# Patient Record
Sex: Female | Born: 1960 | Race: White | Hispanic: No | Marital: Married | Smoking: Current every day smoker
Health system: Southern US, Community
[De-identification: ages and names within clinical notes are randomized; demographics above are authoritative.]

## PROBLEM LIST (undated history)

## (undated) DIAGNOSIS — F329 Major depressive disorder, single episode, unspecified: Secondary | ICD-10-CM

## (undated) DIAGNOSIS — F17200 Nicotine dependence, unspecified, uncomplicated: Secondary | ICD-10-CM

## (undated) DIAGNOSIS — I10 Essential (primary) hypertension: Secondary | ICD-10-CM

## (undated) DIAGNOSIS — G43909 Migraine, unspecified, not intractable, without status migrainosus: Secondary | ICD-10-CM

## (undated) DIAGNOSIS — G5600 Carpal tunnel syndrome, unspecified upper limb: Secondary | ICD-10-CM

## (undated) DIAGNOSIS — F32A Depression, unspecified: Secondary | ICD-10-CM

## (undated) DIAGNOSIS — F419 Anxiety disorder, unspecified: Secondary | ICD-10-CM

## (undated) DIAGNOSIS — F99 Mental disorder, not otherwise specified: Secondary | ICD-10-CM

## (undated) DIAGNOSIS — R002 Palpitations: Secondary | ICD-10-CM

## (undated) DIAGNOSIS — F319 Bipolar disorder, unspecified: Secondary | ICD-10-CM

## (undated) HISTORY — DX: Mental disorder, not otherwise specified: F99

## (undated) HISTORY — PX: LAPAROSCOPIC ENDOMETRIOSIS FULGURATION: SUR769

## (undated) HISTORY — DX: Major depressive disorder, single episode, unspecified: F32.9

## (undated) HISTORY — DX: Bipolar disorder, unspecified: F31.9

## (undated) HISTORY — DX: Essential (primary) hypertension: I10

## (undated) HISTORY — DX: Depression, unspecified: F32.A

## (undated) HISTORY — PX: TONSILLECTOMY AND ADENOIDECTOMY: SUR1326

## (undated) HISTORY — DX: Palpitations: R00.2

## (undated) HISTORY — DX: Carpal tunnel syndrome, unspecified upper limb: G56.00

## (undated) HISTORY — PX: TOTAL ABDOMINAL HYSTERECTOMY: SHX209

## (undated) HISTORY — DX: Anxiety disorder, unspecified: F41.9

## (undated) HISTORY — DX: Migraine, unspecified, not intractable, without status migrainosus: G43.909

## (undated) HISTORY — DX: Nicotine dependence, unspecified, uncomplicated: F17.200

---

## 2001-06-23 ENCOUNTER — Ambulatory Visit (HOSPITAL_COMMUNITY): Admission: RE | Admit: 2001-06-23 | Discharge: 2001-06-23 | Payer: Self-pay | Admitting: Family Medicine

## 2003-02-09 ENCOUNTER — Other Ambulatory Visit: Admission: RE | Admit: 2003-02-09 | Discharge: 2003-02-09 | Payer: Self-pay | Admitting: Gynecology

## 2003-08-28 DIAGNOSIS — I10 Essential (primary) hypertension: Secondary | ICD-10-CM

## 2003-08-28 DIAGNOSIS — F99 Mental disorder, not otherwise specified: Secondary | ICD-10-CM

## 2003-08-28 HISTORY — DX: Mental disorder, not otherwise specified: F99

## 2003-08-28 HISTORY — DX: Essential (primary) hypertension: I10

## 2004-05-09 ENCOUNTER — Other Ambulatory Visit: Admission: RE | Admit: 2004-05-09 | Discharge: 2004-05-09 | Payer: Self-pay | Admitting: Gynecology

## 2004-07-21 ENCOUNTER — Emergency Department (HOSPITAL_COMMUNITY): Admission: EM | Admit: 2004-07-21 | Discharge: 2004-07-22 | Payer: Self-pay | Admitting: Emergency Medicine

## 2005-12-02 ENCOUNTER — Emergency Department (HOSPITAL_COMMUNITY): Admission: EM | Admit: 2005-12-02 | Discharge: 2005-12-03 | Payer: Self-pay | Admitting: Emergency Medicine

## 2006-01-12 ENCOUNTER — Other Ambulatory Visit: Admission: RE | Admit: 2006-01-12 | Discharge: 2006-01-12 | Payer: Self-pay | Admitting: Gynecology

## 2006-07-07 ENCOUNTER — Inpatient Hospital Stay (HOSPITAL_COMMUNITY): Admission: RE | Admit: 2006-07-07 | Discharge: 2006-07-09 | Payer: Self-pay | Admitting: Obstetrics and Gynecology

## 2006-07-07 ENCOUNTER — Encounter (INDEPENDENT_AMBULATORY_CARE_PROVIDER_SITE_OTHER): Payer: Self-pay | Admitting: Specialist

## 2007-01-22 ENCOUNTER — Emergency Department (HOSPITAL_COMMUNITY): Admission: EM | Admit: 2007-01-22 | Discharge: 2007-01-22 | Payer: Self-pay | Admitting: Emergency Medicine

## 2007-09-07 IMAGING — CR DG ABDOMEN ACUTE W/ 1V CHEST
3 series · 3 of 3 positions shown · non-contrast
Comparison: none

CLINICAL DATA: Chest pain, nausea and vomiting. 
 ACUTE ABDOMINAL SERIES:

[view not recorded (1 of 3)]
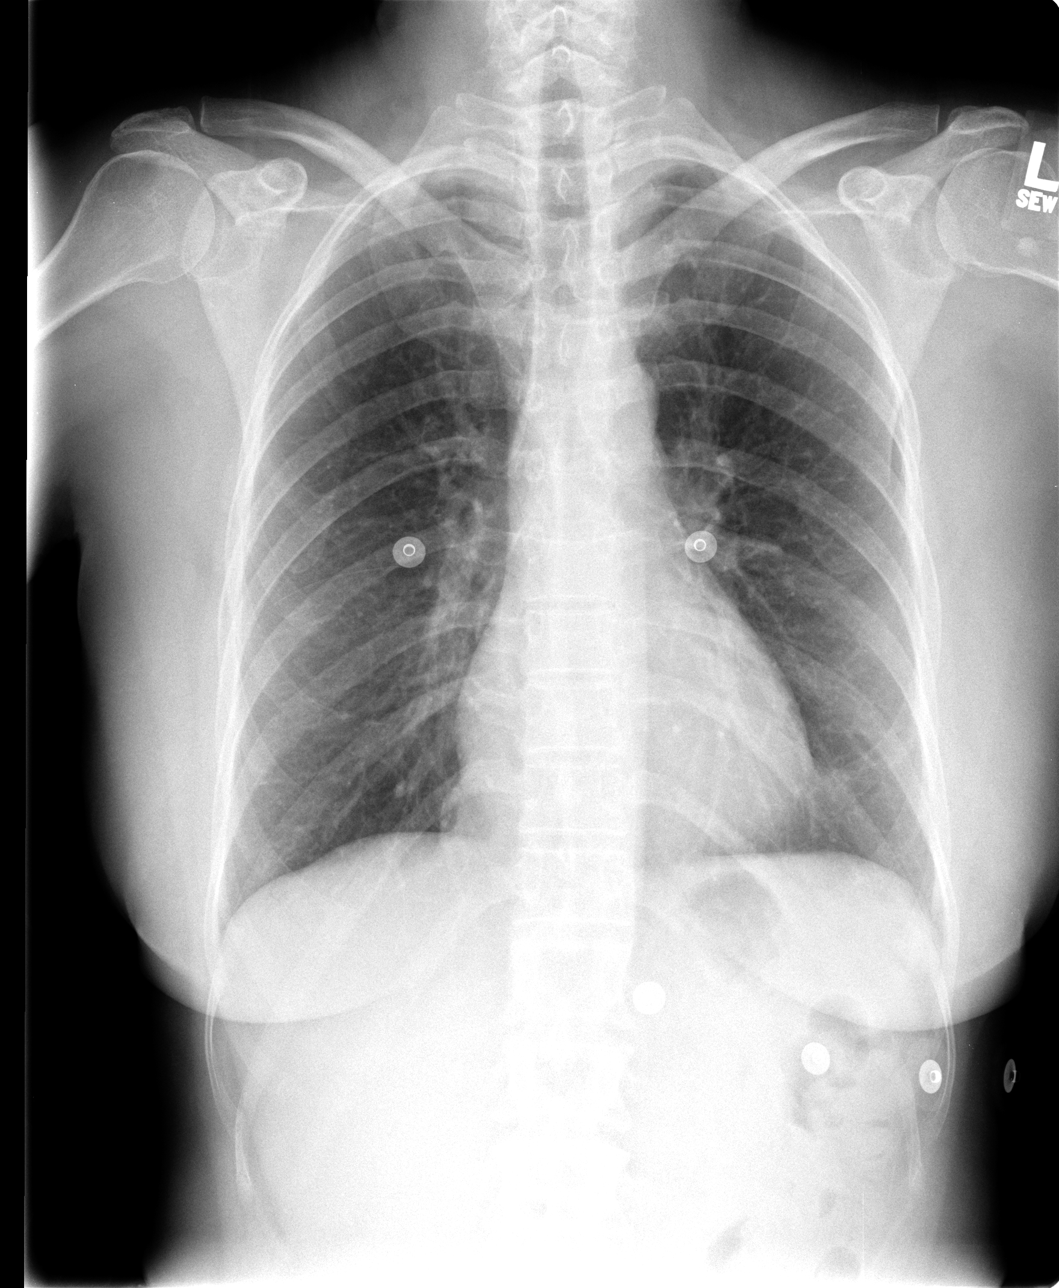

[view not recorded (2 of 3)]
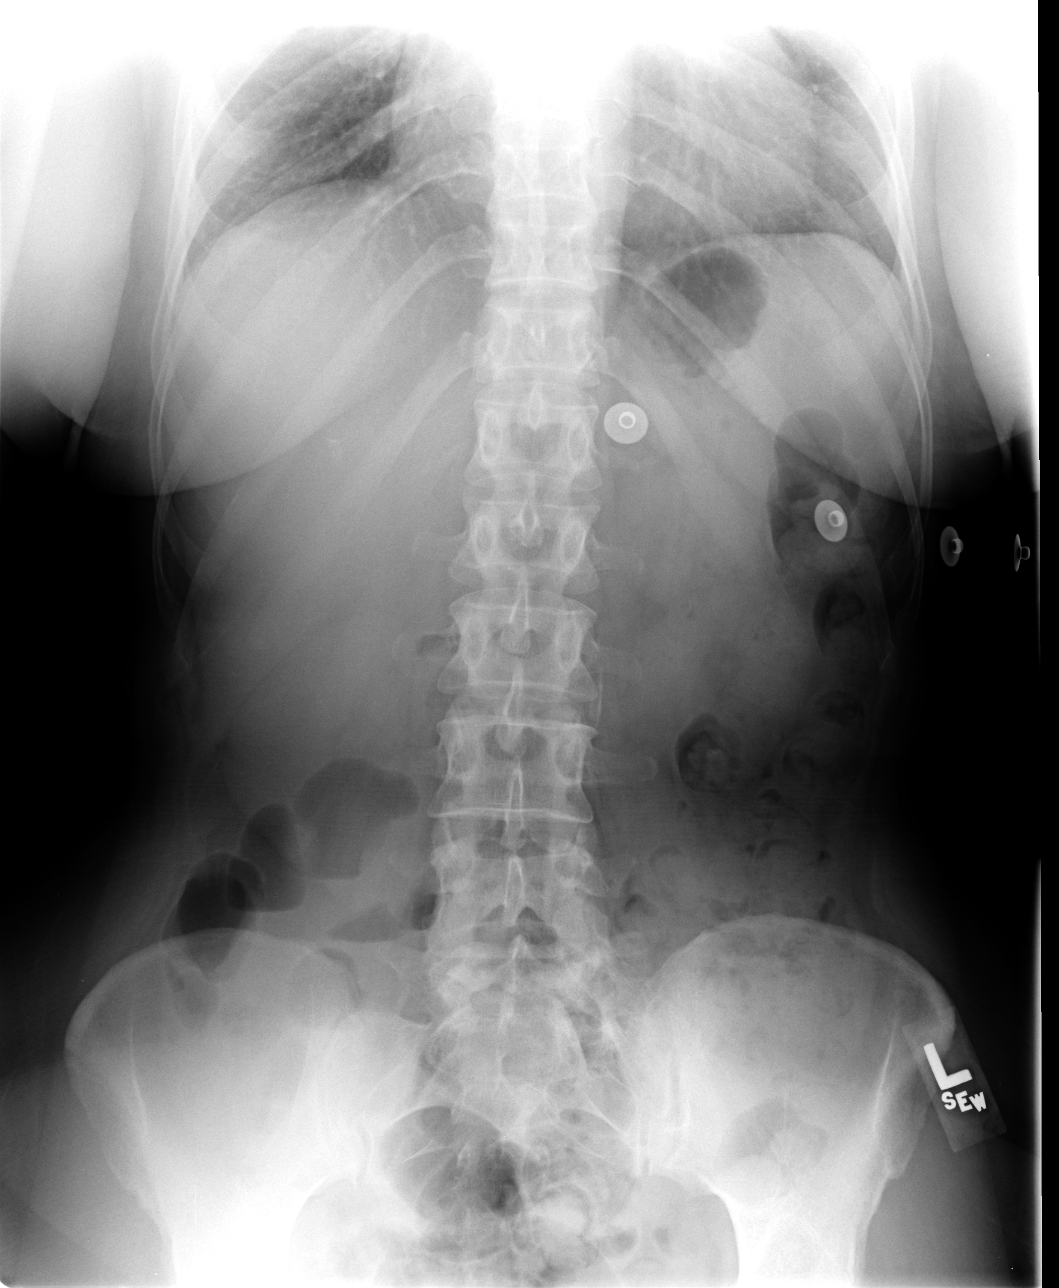

[view not recorded (3 of 3)]
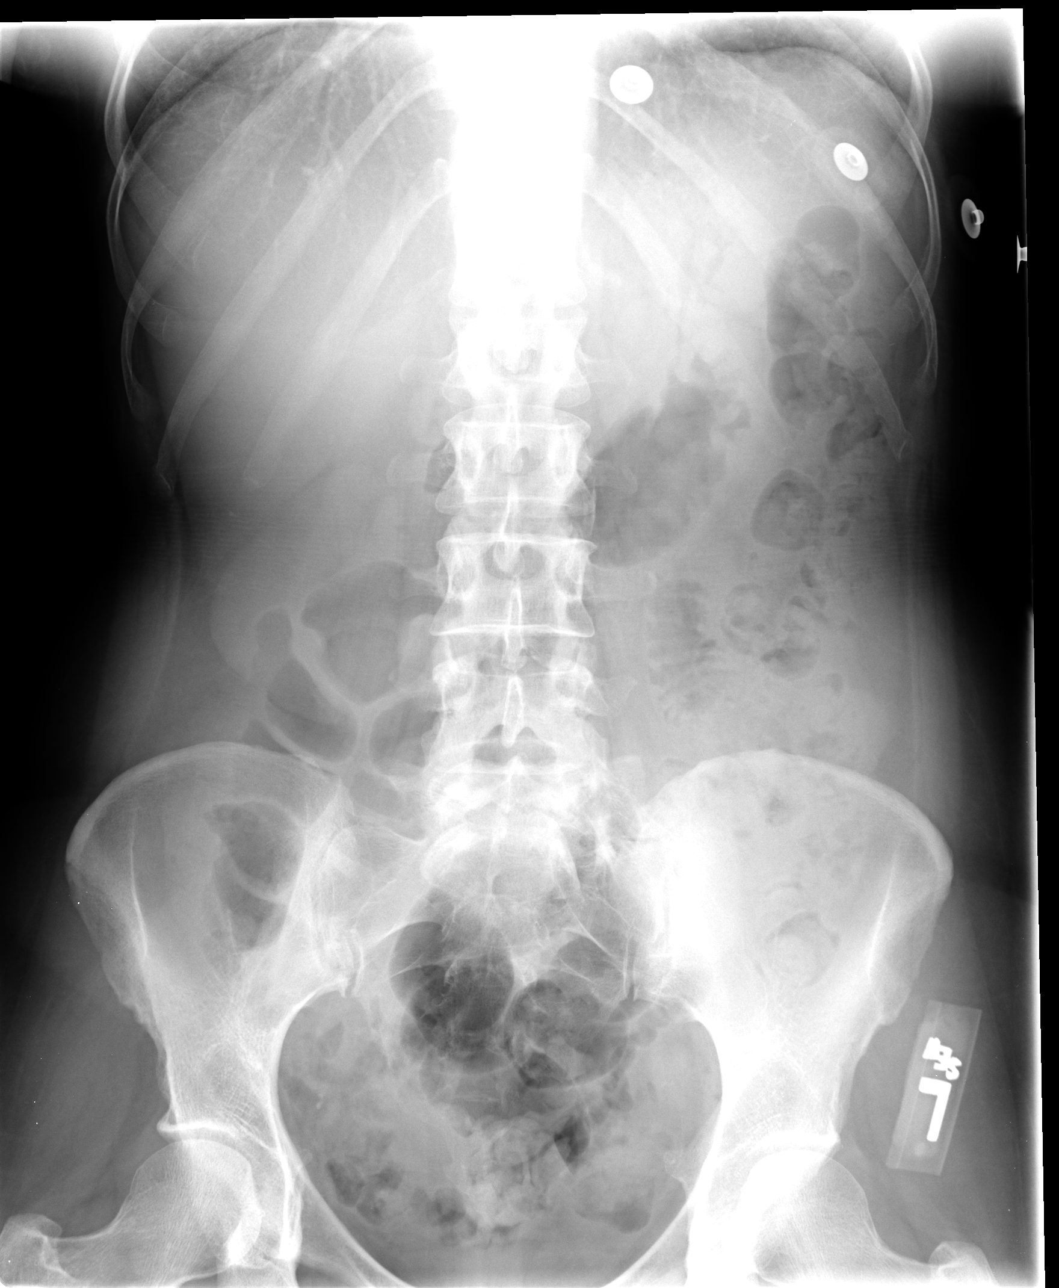

[3 of 3 positions shown; findings below may reference images not displayed]

FINDINGS: Single view of chest:  Chest hyperexpanded but clear.  Heart size is normal.  No effusion or focal bony abnormality.  
 Abdomen:  No free intraperitoneal air.  Gas and stool are scattered through the bowel in a nonspecific, nonobstructive pattern.  Faint calcifications in the right upper quadrant may represent gallstones.
IMPRESSION: 1.  No acute cardiopulmonary disease.
 2.  Negative for free intraperitoneal air or obstruction. 
 3.  Question small gallstone.

## 2008-06-07 ENCOUNTER — Emergency Department (HOSPITAL_COMMUNITY): Admission: EM | Admit: 2008-06-07 | Discharge: 2008-06-07 | Payer: Self-pay | Admitting: Emergency Medicine

## 2009-03-24 ENCOUNTER — Emergency Department (HOSPITAL_COMMUNITY): Admission: RE | Admit: 2009-03-24 | Discharge: 2009-03-24 | Payer: Self-pay | Admitting: Family Medicine

## 2011-02-04 LAB — CBC
HCT: 47.5 % — ABNORMAL HIGH (ref 36.0–46.0)
MCV: 95.8 fL (ref 78.0–100.0)
Platelets: 279 10*3/uL (ref 150–400)
RBC: 4.96 MIL/uL (ref 3.87–5.11)
RDW: 14 % (ref 11.5–15.5)
WBC: 8.4 10*3/uL (ref 4.0–10.5)

## 2011-02-04 LAB — URINALYSIS, ROUTINE W REFLEX MICROSCOPIC
Glucose, UA: NEGATIVE mg/dL
Hgb urine dipstick: NEGATIVE
Nitrite: NEGATIVE
Specific Gravity, Urine: 1.006 (ref 1.005–1.030)
Urobilinogen, UA: 0.2 mg/dL (ref 0.0–1.0)
pH: 6 (ref 5.0–8.0)

## 2011-02-04 LAB — D-DIMER, QUANTITATIVE: D-Dimer, Quant: <0.22 ug/mL-FEU (ref 0.00–0.48)

## 2011-02-04 LAB — DIFFERENTIAL
Basophils Absolute: 0 10*3/uL (ref 0.0–0.1)
Eosinophils Absolute: 0.2 10*3/uL (ref 0.0–0.7)
Eosinophils Relative: 2 % (ref 0–5)
Lymphs Abs: 2.7 10*3/uL (ref 0.7–4.0)
Monocytes Relative: 6 % (ref 3–12)
Neutro Abs: 5 10*3/uL (ref 1.7–7.7)
Neutrophils Relative %: 59 % (ref 43–77)

## 2011-02-04 LAB — COMPREHENSIVE METABOLIC PANEL
AST: 19 U/L (ref 0–37)
CO2: 27 mEq/L (ref 19–32)
Creatinine, Ser: 0.54 mg/dL (ref 0.4–1.2)
GFR calc Af Amer: >60 mL/min (ref >60)
GFR calc non Af Amer: >60 mL/min (ref >60)
Potassium: 3.5 mEq/L (ref 3.5–5.1)

## 2011-03-14 NOTE — Discharge Summary (Signed)
NAMESHEETAL, LYALL NO.:  [id number]   MEDICAL RECORD NO.:  [id number]          PATIENT TYPE:  INP   LOCATION:  9318                          FACILITY:  [facility]   PHYSICIAN:  Malva Limes, M.D.    DATE OF BIRTH:  Mar 30, 1961   DATE OF ADMISSION:  07/07/2006  DATE OF DISCHARGE:  07/09/2006                                 DISCHARGE SUMMARY   PRINCIPAL DISCHARGE DIAGNOSES:  1. Chronic pelvic pain.  2. Endometriosis.  3. Dyspareunia.  4. Dysmenorrhea.   PRINCIPAL PROCEDURES:  Total abdominal hysterectomy with bilateral salpingo-  oophorectomy.   HOSPITAL COURSE:  Ms. Brandi Woodward is a 50 year old white female who presented to  [facility] for total abdominal hysterectomy with bilateral salpingo-  oophorectomy secondary to a long history of chronic pelvic pain, dyspareunia  and a history of endometriosis.   HOSPITAL COURSE:  The patient underwent a total abdominal hysterectomy with  bilateral salpingo-oophorectomy.  A complete description of this procedure  can be found in the dictated operative note.  The patient's pathology  returned with an endocervical polyp and adhesions along with endometriosis.  The patient's preoperative hemoglobin was 14.5 and postoperative 12.7.  The  patient's postoperative course was uncomplicated.  The patient was  discharged to home on postoperative day #2.  At that time, she was  ambulating without difficulty or eating a regular diet.  Her incision  appeared to be healing well.  The patient was discharged to home with  Percocet 5 mg 1 to 2 p.o. q.4h. and Estrace 1 mg p.o. q.a.m.  She will  follow-up in the office in 4 weeks.           ______________________________  Malva Limes, M.D.     MA/MEDQ  D:  08/10/2006  T:  08/11/2006  Job:  [id number]

## 2011-03-14 NOTE — Op Note (Signed)
Brandi Woodward, BOODY NO.:  [id number]   MEDICAL RECORD NO.:  [id number]          PATIENT TYPE:  INP   LOCATION:  9318                          FACILITY:  [facility]   PHYSICIAN:  Malva Limes, M.D.    DATE OF BIRTH:  [birth year]   DATE OF PROCEDURE:  07/07/2006  DATE OF DISCHARGE:                                 OPERATIVE REPORT   PREOPERATIVE DIAGNOSES:  1. Endometriosis.  2. Chronic pelvic pain.  3. Dyspareunia.  4. Dysmenorrhea.   POSTOPERATIVE DIAGNOSES:  1. Endometriosis.  2. Chronic pelvic pain.  3. Dyspareunia.  4. Dysmenorrhea.   OPERATION PERFORMED:  1. Total abdominal hysterectomy.  2. Bilateral salpingo-oophorectomy.   SURGEON:  Malva Limes, M.D.   ASSISTANT:  __________  Laural Roes   ANESTHESIA:  General endotracheal.   MEDICATIONS:  Antibiotics consisted of Ancef 1 gram.   DRAINS:  Foley to bedside drainage.   ESTIMATED BLOOD LOSS:  The estimated blood loss was 150 mL.   SPECIMENS:  Cervix, uterus, fallopian tubes and ovaries were all sent to  pathology.   DESCRIPTION OF THE OPERATION:  The patient was brought to the operating room  where she was placed in the dorsal supine position and a general anesthetic  was then administered without complications.  She was prepped and draped in  the usual fashion for this procedure.  A Foley catheter was placed.   A Pfannenstiel incision was made and this was carried down to the fascia.  The fascia was entered through the midline and extended laterally with the  Mayo scissors.  The rectus muscles were dissected from the fascia with the  Bovie.  The rectus muscle was divided in the midline and taken superiorly  and inferiorly.  The parietal peritoneum was entered sharply and taken down  superiorly and inferiorly.   Exam of the abdominal and pelvic contents was performed.  The patient had  normal liver, gallbladder, appendix and kidneys.  There is no evidence of  any lymphadenopathy in the pelvis.   The patient had endometriosis involving  her left adnexa as well as evidence of past tubal ligation.   At this point the bowel was packed away after an O'Connor-O'Sullivan  retractor was placed.  The uterus was grasped.  Adhesions involving the  colon and the left adnexa were taken down sharply.  The left round ligament  was then ligated with 0-Monocryl suture and transected with the Bovie.  The  broad ligament was then opened.  Ureters and vessels were identified.  The  infundibulopelvic  ligament was then isolated, clamped, cut and ligated with  0-Monocryl suture times two.  The uterine vessels were then skeletonized,  clamped and ligated with 0-Monocryl suture.   A similar procedure was performed on the opposite side.  The cardinal  ligaments were then serially clamped, cut and ligated with 0-Monocryl  sutures.  Once the level of the external os was reached the vagina was cross-  clamped and the specimen was removed.   The vaginal angles were then ligated with 0-Monocryl suture in a Heaney  fashion.  The remaining vaginal  cuff was closed using interrupted 0-Monocryl  suture in a figure-of-eight fashion.  The pelvis was then irrigated and all  pedicles were found to be hemostatic.  At this point the retractors were  removed.  The packs were removed and the parietal peritoneum and rectus  muscles were reapproximated in the midline using 2-0 Monocryl  in  running  fashion.  The fascia was closed using 0-Monocryl suture in a running fashion  and subcuticular tissue was made hemostatic with the Bovie.  Stainless steel  clips were used to close the skin.   The patient tolerated the procedure well and she was taken to the recovery  room in stable condition.           ______________________________  Malva Limes, M.D.     MA/MEDQ  D:  07/07/2006  T:  07/08/2006  Job:  [id number]

## 2011-04-05 ENCOUNTER — Emergency Department (HOSPITAL_COMMUNITY)
Admission: EM | Admit: 2011-04-05 | Discharge: 2011-04-05 | Disposition: A | Discharge status: Home / Self Care | Payer: Self-pay | Attending: Emergency Medicine | Admitting: Emergency Medicine

## 2011-04-05 ENCOUNTER — Emergency Department (HOSPITAL_COMMUNITY): Payer: Self-pay

## 2011-04-05 DIAGNOSIS — M545 Low back pain, unspecified: Secondary | ICD-10-CM | POA: Insufficient documentation

## 2011-04-05 DIAGNOSIS — M549 Dorsalgia, unspecified: Secondary | ICD-10-CM | POA: Insufficient documentation

## 2011-04-05 LAB — URINALYSIS, ROUTINE W REFLEX MICROSCOPIC
Hgb urine dipstick: NEGATIVE
Leukocytes, UA: NEGATIVE
Protein, ur: NEGATIVE mg/dL
Specific Gravity, Urine: 1.016 (ref 1.005–1.030)
pH: 6 (ref 5.0–8.0)

## 2011-07-25 LAB — URINALYSIS, ROUTINE W REFLEX MICROSCOPIC
Nitrite: NEGATIVE
Protein, ur: NEGATIVE
Urobilinogen, UA: 0.2
pH: 6

## 2011-07-25 LAB — POCT I-STAT, CHEM 8
BUN: 9
Chloride: 112
Creatinine, Ser: 0.7
Glucose, Bld: 90

## 2011-07-25 LAB — POCT CARDIAC MARKERS
CKMB, poc: <1 — ABNORMAL LOW
Myoglobin, poc: 29

## 2011-07-25 LAB — URINE CULTURE
Colony Count: NO GROWTH
Culture: NO GROWTH

## 2011-07-25 LAB — RAPID URINE DRUG SCREEN, HOSP PERFORMED
Amphetamines: NOT DETECTED
Benzodiazepines: POSITIVE — AB
Opiates: NOT DETECTED

## 2011-07-25 LAB — POCT PREGNANCY, URINE: Preg Test, Ur: NEGATIVE

## 2011-12-04 ENCOUNTER — Encounter: Payer: Self-pay | Admitting: *Deleted

## 2011-12-04 DIAGNOSIS — F17200 Nicotine dependence, unspecified, uncomplicated: Secondary | ICD-10-CM | POA: Insufficient documentation

## 2011-12-04 DIAGNOSIS — G43909 Migraine, unspecified, not intractable, without status migrainosus: Secondary | ICD-10-CM | POA: Insufficient documentation

## 2011-12-04 DIAGNOSIS — I10 Essential (primary) hypertension: Secondary | ICD-10-CM

## 2011-12-05 ENCOUNTER — Ambulatory Visit (INDEPENDENT_AMBULATORY_CARE_PROVIDER_SITE_OTHER): Payer: Medicare Other | Admitting: Physician Assistant

## 2011-12-05 ENCOUNTER — Encounter: Payer: Self-pay | Admitting: Physician Assistant

## 2011-12-05 VITALS — BP 148/84 | HR 65 | Temp 97.7°F | Resp 20 | Ht 62.0 in | Wt 127.8 lb

## 2011-12-05 DIAGNOSIS — I10 Essential (primary) hypertension: Secondary | ICD-10-CM

## 2011-12-05 DIAGNOSIS — M62838 Other muscle spasm: Secondary | ICD-10-CM

## 2011-12-05 LAB — COMPREHENSIVE METABOLIC PANEL
ALT: 11 U/L (ref 0–35)
Albumin: 4.3 g/dL (ref 3.5–5.2)
Alkaline Phosphatase: 75 U/L (ref 39–117)
BUN: 12 mg/dL (ref 6–23)
Chloride: 111 mEq/L (ref 96–112)
Creat: 0.51 mg/dL (ref 0.50–1.10)
Total Protein: 6.5 g/dL (ref 6.0–8.3)

## 2011-12-05 LAB — CBC WITH DIFFERENTIAL/PLATELET
Hemoglobin: 15.5 g/dL — ABNORMAL HIGH (ref 12.0–15.0)
Lymphocytes Relative: 36 % (ref 12–46)
Lymphs Abs: 2 10*3/uL (ref 0.7–4.0)
MCH: 32 pg (ref 26.0–34.0)
MCHC: 33.5 g/dL (ref 30.0–36.0)
MCV: 95.5 fL (ref 78.0–100.0)
Monocytes Relative: 7 % (ref 3–12)
Neutrophils Relative %: 51 % (ref 43–77)
Platelets: 292 10*3/uL (ref 150–400)
RBC: 4.85 MIL/uL (ref 3.87–5.11)
RDW: 13 % (ref 11.5–15.5)

## 2011-12-05 LAB — TSH: TSH: 2.165 u[IU]/mL (ref 0.350–4.500)

## 2011-12-05 LAB — LIPID PANEL: Triglycerides: 76 mg/dL (ref <150)

## 2011-12-05 MED ORDER — NAPROXEN 500 MG PO TABS: 500.0000 mg | ORAL_TABLET | Freq: Two times a day (BID) | ORAL | Status: DC

## 2011-12-05 MED ORDER — DULOXETINE HCL 30 MG PO CPEP: 30.0000 mg | ORAL_CAPSULE | Freq: Every day | ORAL | Status: DC

## 2011-12-05 MED ORDER — CYCLOBENZAPRINE HCL 5 MG PO TABS: ORAL_TABLET | ORAL | Status: DC

## 2011-12-05 MED ORDER — LISINOPRIL-HYDROCHLOROTHIAZIDE 10-12.5 MG PO TABS: 1.0000 | ORAL_TABLET | Freq: Every day | ORAL | Status: DC

## 2011-12-05 MED ORDER — CYCLOBENZAPRINE HCL 5 MG PO TABS: 5.0000 mg | ORAL_TABLET | Freq: Three times a day (TID) | ORAL | Status: DC | PRN

## 2011-12-05 MED ORDER — FLUTICASONE PROPIONATE 50 MCG/ACT NA SUSP: 2.0000 | Freq: Every day | NASAL | Status: DC

## 2011-12-05 MED ORDER — NAPROXEN 500 MG PO TABS: 500.0000 mg | ORAL_TABLET | Freq: Two times a day (BID) | ORAL | Status: AC

## 2011-12-05 NOTE — Progress Notes (Signed)
  Subjective:    Patient ID: Brandi Woodward, female    DOB: 03-Oct-1961, 51 y.o.   MRN: [id number]  HPI  Patient out of BP meds X 3 months.  C/o fatigue on metoprolol.  C/O upper back pain X 1 week.  She has been helping her sister in the yard, doing strenuous activity.  No paresthesias, no neck pain, no weakness, denies CP, No SOB    Review of Systems  All other systems reviewed and are negative.       Objective:   Physical Exam  Constitutional: She is oriented to person, place, and time. She appears well-developed and well-nourished.  HENT:  Head: Normocephalic and atraumatic.  Right Ear: External ear normal.  Left Ear: External ear normal.       Turbinates enlarged and pale  Neck: Normal range of motion. Neck supple. No thyromegaly present.  Cardiovascular: Normal rate, regular rhythm, normal heart sounds and intact distal pulses.  Exam reveals no gallop and no friction rub.   No murmur heard. Pulmonary/Chest: Effort normal and breath sounds normal.  Musculoskeletal:       Spasm along trapezius B  Neurological: She is alert and oriented to person, place, and time. She has normal reflexes. She displays normal reflexes. No cranial nerve deficit. She exhibits normal muscle tone. Coordination normal.  Skin: Skin is warm and dry.  Psychiatric: She has a normal mood and affect. Her behavior is normal. Thought content normal.          Assessment & Plan:  Musculoskeletal upper back pain and muscle spasm Rhinitis-flonase htn-resume treatment

## 2011-12-12 NOTE — Progress Notes (Signed)
Quick Note:    Left message on machine to call back.  ______

## 2011-12-30 ENCOUNTER — Ambulatory Visit (INDEPENDENT_AMBULATORY_CARE_PROVIDER_SITE_OTHER): Payer: Medicare Other | Admitting: Family Medicine

## 2011-12-30 VITALS — BP 125/77 | HR 80 | Temp 98.3°F | Resp 16 | Ht 63.0 in | Wt 128.0 lb

## 2011-12-30 DIAGNOSIS — F3175 Bipolar disorder, in partial remission, most recent episode depressed: Secondary | ICD-10-CM

## 2011-12-30 DIAGNOSIS — Z79899 Other long term (current) drug therapy: Secondary | ICD-10-CM

## 2011-12-30 NOTE — Progress Notes (Signed)
  Subjective:    Patient ID: Geoffry Paradise, female    DOB: 16-Apr-1961, 51 y.o.   MRN: [id number]  HPI 51 yo female here for bloodwork (has orders) for Dr. Tiajuana Amass (psychiatry).  Started depakote a couple weeks ago for Bipolar disorder and he wants bloodwork checked for this reason.  Sees him again Monday.  Has been very sleepy on the depakote.  Plans to discuss this with him.    Fax results to Dr. Tomasa Rand at [phone] Review of Systems Negative except as per HPI     Objective:   Physical Exam  Constitutional: She appears well-developed and well-nourished.  Cardiovascular: Normal rate, regular rhythm, normal heart sounds and intact distal pulses.   No murmur heard. Pulmonary/Chest: Effort normal and breath sounds normal.  Neurological: She is alert.  Skin: Skin is warm and dry.          Assessment & Plan: Dru  Bipolar Disorder, on new med. Labs drawn per order (VPA level, LFTs, and CBC with diff) Advised patient that in future, if she only  Needs labwork and has no other illness, she may prefer to go [place], although we'd always be happy to see her but our long weights can make it hard.  She appreciated information.

## 2011-12-31 LAB — CBC WITH DIFFERENTIAL/PLATELET
Eosinophils Absolute: 0.3 10*3/uL (ref 0.0–0.7)
Hemoglobin: 15.5 g/dL — ABNORMAL HIGH (ref 12.0–15.0)
Lymphs Abs: 1.2 10*3/uL (ref 0.7–4.0)
MCH: 32.3 pg (ref 26.0–34.0)
Monocytes Relative: 9 % (ref 3–12)
Neutro Abs: 1.9 10*3/uL (ref 1.7–7.7)
Neutrophils Relative %: 51 % (ref 43–77)
RBC: 4.8 MIL/uL (ref 3.87–5.11)

## 2011-12-31 LAB — VALPROIC ACID LEVEL: Valproic Acid Lvl: 148.6 ug/mL — ABNORMAL HIGH (ref 50.0–100.0)

## 2011-12-31 LAB — HEPATIC FUNCTION PANEL
Total Bilirubin: 0.1 mg/dL — ABNORMAL LOW (ref 0.3–1.2)
Total Protein: 6.4 g/dL (ref 6.0–8.3)

## 2012-02-22 ENCOUNTER — Ambulatory Visit (INDEPENDENT_AMBULATORY_CARE_PROVIDER_SITE_OTHER): Payer: Medicare Other | Admitting: Family Medicine

## 2012-02-22 ENCOUNTER — Telehealth: Payer: Self-pay

## 2012-02-22 VITALS — BP 131/82 | HR 92 | Temp 98.5°F | Resp 18 | Ht 62.5 in | Wt 127.8 lb

## 2012-02-22 DIAGNOSIS — R05 Cough: Secondary | ICD-10-CM

## 2012-02-22 DIAGNOSIS — J301 Allergic rhinitis due to pollen: Secondary | ICD-10-CM

## 2012-02-22 DIAGNOSIS — R071 Chest pain on breathing: Secondary | ICD-10-CM

## 2012-02-22 DIAGNOSIS — R059 Cough, unspecified: Secondary | ICD-10-CM

## 2012-02-22 DIAGNOSIS — J029 Acute pharyngitis, unspecified: Secondary | ICD-10-CM

## 2012-02-22 DIAGNOSIS — J45909 Unspecified asthma, uncomplicated: Secondary | ICD-10-CM

## 2012-02-22 LAB — POCT RAPID STREP A (OFFICE): Rapid Strep A Screen: NEGATIVE

## 2012-02-22 MED ORDER — AZITHROMYCIN 250 MG PO TABS: ORAL_TABLET | ORAL | Status: AC

## 2012-02-22 MED ORDER — BENZONATATE 100 MG PO CAPS: 100.0000 mg | ORAL_CAPSULE | Freq: Three times a day (TID) | ORAL | Status: AC | PRN

## 2012-02-22 MED ORDER — ALBUTEROL SULFATE HFA 108 (90 BASE) MCG/ACT IN AERS: 2.0000 | INHALATION_SPRAY | Freq: Four times a day (QID) | RESPIRATORY_TRACT | Status: DC | PRN

## 2012-02-22 NOTE — Patient Instructions (Signed)
Can take zyrtec 1 per day in addition to taking the nasal spray for allergies.  zpak for possible early bronchitis.  Albuterol inhaler if wheeze or short of breath, and tessalon perles up to 3 times per day as needed for cough.

## 2012-02-22 NOTE — Telephone Encounter (Signed)
Pt states she has bronchitis and wants [place] to call something in for her. Has a really sore throat and cough. Please call pt at [phone]

## 2012-02-22 NOTE — Progress Notes (Signed)
  Subjective:    Patient ID: Geoffry Paradise, female    DOB: Aug 05, 1961, 51 y.o.   MRN: [id number]  HPI GREYDIS STLOUIS is a 51 y.o. female 4-5 days of cough, sore throat - sandpaper feeling.  Chills , but no known objective fever.  Hx allergic rhinitis.. Dx with bronchospasm in past.  Squeezing sensation at night, with cough.  Taking flonase for allergies, and hydrogen peroxide gargles with water.   No known hx CAD -  Stress test few years ago was normal.   Review of Systems  Constitutional: Negative for fever and chills.  HENT: Positive for congestion, sore throat, rhinorrhea and sneezing.   Eyes: Positive for discharge and itching.  Respiratory: Positive for cough and shortness of breath.        With coughing spells.  Cardiovascular: Positive for chest pain.       With cough ,and tight at night/squeeze sensation that causes cough at night.  No radiation.        Objective:   Physical Exam  Constitutional: She is oriented to person, place, and time. She appears well-developed and well-nourished.  HENT:  Head: Normocephalic and atraumatic.  Right Ear: External ear normal.  Left Ear: External ear normal.  Mouth/Throat: Oropharynx is clear and moist. No oropharyngeal exudate.  Eyes: Pupils are equal, round, and reactive to light.  Neck: Normal range of motion.  Cardiovascular: Normal rate, regular rhythm, normal heart sounds and intact distal pulses.   Pulmonary/Chest: Effort normal and breath sounds normal. No respiratory distress. She has no wheezes.       Coughs during exam, but no wheeze heard currently.  Lymphadenopathy:    She has no cervical adenopathy.  Neurological: She is alert and oriented to person, place, and time.  Skin: Skin is warm and dry. No rash noted.  Psychiatric: She has a normal mood and affect. Her behavior is normal.   Results for orders placed in visit on 02/22/12  POCT RAPID STREP A (OFFICE)      Component Value Range   Rapid Strep A Screen Negative   Negative    EKG: NSR, no acute findings.     Assessment & Plan:  JEFFRIE STANDER is a 51 y.o. female 1. Cough    2. Allergic rhinitis    3. Chest pain  EKG 12-Lead  4. Asthmatic bronchitis    5. Pharyngitis  POCT rapid strep A   Cont allergy treatment, with flonase.  Add otc zyrec or claritin,  rx proair q 6hprn if wheeze/sob.  rtc if using more than 2x/day.  Zpak.  Tessalon perles up to TID prn.  rtc in next 2-3 days if not improving.

## 2012-02-22 NOTE — Telephone Encounter (Signed)
Patient came in and was seen today.  Disregard.

## 2012-03-31 ENCOUNTER — Ambulatory Visit (INDEPENDENT_AMBULATORY_CARE_PROVIDER_SITE_OTHER): Payer: Medicare Other | Admitting: Emergency Medicine

## 2012-03-31 ENCOUNTER — Ambulatory Visit: Payer: Medicare Other

## 2012-03-31 VITALS — BP 145/86 | HR 101 | Temp 98.0°F | Resp 18 | Ht 62.5 in | Wt 127.0 lb

## 2012-03-31 DIAGNOSIS — M25559 Pain in unspecified hip: Secondary | ICD-10-CM

## 2012-03-31 DIAGNOSIS — S80219A Abrasion, unspecified knee, initial encounter: Secondary | ICD-10-CM

## 2012-03-31 DIAGNOSIS — M5430 Sciatica, unspecified side: Secondary | ICD-10-CM

## 2012-03-31 DIAGNOSIS — M79609 Pain in unspecified limb: Secondary | ICD-10-CM

## 2012-03-31 DIAGNOSIS — IMO0002 Reserved for concepts with insufficient information to code with codable children: Secondary | ICD-10-CM

## 2012-03-31 DIAGNOSIS — M542 Cervicalgia: Secondary | ICD-10-CM

## 2012-03-31 DIAGNOSIS — S139XXA Sprain of joints and ligaments of unspecified parts of neck, initial encounter: Secondary | ICD-10-CM

## 2012-03-31 MED ORDER — CYCLOBENZAPRINE HCL 10 MG PO TABS: 10.0000 mg | ORAL_TABLET | Freq: Three times a day (TID) | ORAL | Status: AC | PRN

## 2012-03-31 MED ORDER — NAPROXEN SODIUM 550 MG PO TABS: 550.0000 mg | ORAL_TABLET | Freq: Two times a day (BID) | ORAL | Status: AC

## 2012-03-31 MED ORDER — TRAMADOL HCL 50 MG PO TABS: 50.0000 mg | ORAL_TABLET | Freq: Three times a day (TID) | ORAL | Status: AC | PRN

## 2012-03-31 NOTE — Patient Instructions (Signed)
Abrasions Abrasions are skin scrapes. Their treatment depends on how large and deep the abrasion is. Abrasions do not extend through all layers of the skin. A cut or lesion through all skin layers is called a laceration. HOME CARE INSTRUCTIONS   If you were given a dressing, change it at least once a day or as instructed by your caregiver. If the bandage sticks, soak it off with a solution of water or hydrogen peroxide.   Twice a day, wash the area with soap and water to remove all the cream/ointment. You may do this in a sink, under a tub faucet, or in a shower. Rinse off the soap and pat dry with a clean towel. Look for signs of infection (see below).   Reapply cream/ointment according to your caregiver's instruction. This will help prevent infection and keep the bandage from sticking. Telfa or gauze over the wound and under the dressing or wrap will also help keep the bandage from sticking.   If the bandage becomes wet, dirty, or develops a foul smell, change it as soon as possible.   Only take over-the-counter or prescription medicines for pain, discomfort, or fever as directed by your caregiver.  SEEK IMMEDIATE MEDICAL CARE IF:   Increasing pain in the wound.   Signs of infection develop: redness, swelling, surrounding area is tender to touch, or pus coming from the wound.   You have a fever.   Any foul smell coming from the wound or dressing.  Most skin wounds heal within ten days. Facial wounds heal faster. However, an infection may occur despite proper treatment. You should have the wound checked for signs of infection within 24 to 48 hours or sooner if problems arise. If you were not given a wound-check appointment, look closely at the wound yourself on the second day for early signs of infection listed above. MAKE SURE YOU:   Understand these instructions.   Will watch your condition.   Will get help right away if you are not doing well or get worse.  Document Released:  07/23/2005 Document Revised: 10/02/2011 Document Reviewed: 09/16/2011 [facility] Patient Information 2012 [place], [place].Contusion (Bruise) of Foot Injury to the foot causes bruises (contusions). Contusions are caused by bleeding from small blood vessels that allow blood to leak out into the muscles, cord-like structures that attach muscle to bone (tendons), and/or other soft tissue.  CAUSES  Contusions of the foot are common. Bruises are frequently seen from:  Contact sports injuries.   The use of medications that thin the blood (anti-coagulants).   Aspirin and non-steroidal anti-inflammatory agents that decrease the clotting ability.   People with vitamin deficiencies.  SYMPTOMS  Signs of foot injury include pain and swelling. At first there may be discoloration from blood under the skin. This will appear blue to purple in color. As the bruise ages, the color turns yellow. Swelling may limit the movement of the toes.  Complications from foot injury may include:  Collections of blood leading to disability if calcium deposits form. These can later limit movement in the foot.   Infection of the foot if there are breaks in the skin.   Rupture of the tendons that may need surgical repair.  DIAGNOSIS  Diagnosing foot injuries can be made by observation. If problems continue, X-rays may be needed to make sure there are no broken bones (fractures). Continuing problems may require physical therapy.  HOME CARE INSTRUCTIONS   Apply ice to the injury for 15 to 20 minutes, 3 to  4 times per day. Put the ice in a plastic bag and place a towel between the bag of ice and your skin.   An elastic wrap (like an Ace bandage) may be used to keep swelling down.   Keep foot elevated to reduce swelling and discomfort.   Try to avoid standing or walking while the foot is painful. Do not resume use until instructed by your caregiver. Then begin use gradually. If pain develops, decrease use and continue the  above measures. Gradually increase activities that do not cause discomfort until you slowly have normal use.   Only take over-the-counter or prescription medicines for pain, discomfort, or fever as directed by your caregiver. Use only if your caregiver has not given medications that would interfere.   Begin daily rehabilitation exercises when supportive wrapping is no longer needed.   Use ice massage for 10 minutes before and after workouts. Fill a large styrofoam cup with water and freeze. Tear a small amount of foam from the top so ice protrudes. Massage ice firmly over the injured area in a circle about the size of a softball.   Always eat a well balanced diet.   Follow all instructions for follow up with your caregiver, any orthopedic referrals, physical therapy and rehabilitation. Any delay in obtaining necessary care could result in delayed healing, and temporary or permanent disability.  SEEK IMMEDIATE MEDICAL CARE IF:   Your pain and swelling increase, or pain is uncontrolled with medications.   You have loss of feeling in your foot, or your foot turns cold or blue.   An oral temperature above 102 F (38.9 C) develops, not controlled by medication.   Your foot becomes warm to touch, or you have more pain with movement of your toes.   You have a foot contusion that does not improve in 1 or 2 days.   Skin is broken and signs of infection occur (drainage, increasing pain, fever, headache, muscle aches, dizziness or a general ill feeling).   You develop new, unexplained symptoms, or an increase of the symptoms that brought you to your caregiver.  MAKE SURE YOU:   Understand these instructions.   Will watch your condition.   Will get help right away if you are not doing well or get worse.  Document Released: 08/04/2006 Document Revised: 10/02/2011 Document Reviewed: 09/16/2011 [facility] Patient Information 2012 [place], [place].Cervical Strain Care After A cervical strain is  when the muscles and ligaments in your neck have been stretched. The bones are not broken. If you had any problems moving your arms or legs immediately after the injury, even if the problem has gone away, make sure to tell this to your caregiver.  HOME CARE INSTRUCTIONS   While awake, apply ice packs to the neck or areas of pain about every 1 to 2 hours, for 15 to 20 minutes at a time. Do this for 2 days. If you were given a cervical collar for support, ask your caregiver if you may remove it for bathing or applying ice.   If given a cervical collar, wear as instructed. Do not remove any collar unless instructed by a caregiver.   Only take over-the-counter or prescription medicines for pain, discomfort, or fever as directed by your caregiver.  Recheck with the hospital or clinic after a radiologist has read your X-rays. Recheck with the hospital or clinic to make sure the initial readings are correct. Do this also to determine if you need further studies. It is your responsibility  to find out your X-ray results. X-rays are sometimes repeated in one week to ten days. These are often repeated to make sure that a hairline fracture was not overlooked. Ask your caregiver how you are to find out about your radiology (X-ray) results. SEEK IMMEDIATE MEDICAL CARE IF:   You have increasing pain in your neck.   You develop difficulties swallowing or breathing.   You have numbness, weakness, or movement problems in the arms or legs.   You have difficulty walking.   You develop bowel or bladder retention or incontinence.   You have problems with walking.  MAKE SURE YOU:   Understand these instructions.   Will watch your condition.   Will get help right away if you are not doing well or get worse.  Document Released: 10/13/2005 Document Revised: 06/25/2011 Document Reviewed: 05/26/2008 [facility] Patient Information 2012 [place], [place].

## 2012-03-31 NOTE — Progress Notes (Signed)
  Subjective:    Patient ID: Brandi Woodward, female    DOB: 1961-10-15, 51 y.o.   MRN: [id number]  HPI Comments: Low back pain after a fall.  Non radiating.  No neuro symptoms.  Pain in sides of low back and right hip.  Pain in left lateral mid foot.  Twisted foot when she fell while pushing a wheelbarrow.  Able to bear weight easily.  Neck Pain  This is a new problem. The current episode started today. The problem occurs constantly. The problem has been unchanged. The pain is associated with a fall. The pain is present in the occipital region. The quality of the pain is described as aching. The pain is at a severity of 4/10. The pain is moderate. The symptoms are aggravated by twisting, bending and position.  Back Pain  Knee Pain   Hip Pain   Ankle Pain       Review of Systems  HENT: Positive for neck pain.   Eyes: Negative.   Respiratory: Negative.   Cardiovascular: Negative.   Gastrointestinal: Negative.   Musculoskeletal: Positive for back pain and joint swelling.  Neurological: Negative.        Objective:   Physical Exam  Constitutional: She is oriented to person, place, and time. She appears well-developed and well-nourished.  HENT:  Head: Normocephalic and atraumatic.  Right Ear: External ear normal.  Left Ear: External ear normal.  Eyes: Conjunctivae are normal. Pupils are equal, round, and reactive to light.  Neck: Neck supple.  Cardiovascular: Normal rate and regular rhythm.   Pulmonary/Chest: Effort normal.  Abdominal: Soft.  Musculoskeletal: Normal range of motion. She exhibits tenderness (tender midfoot left dorsal foot.  some ecchymosis.  abrasion right knee anteriorly with no limitation of free  ROM  and no effusion .  joint stable.  both hips full painless ROM.).  Neurological: She is alert and oriented to person, place, and time. Coordination normal.  Skin: Skin is warm and dry.          Assessment & Plan:  [facility] reading (PRIMARY) by  Dr. Dareen Piano  negative for fracture other than loss of cervical lordosis.

## 2012-06-03 ENCOUNTER — Ambulatory Visit: Payer: Medicare Other | Admitting: Family Medicine

## 2012-10-01 ENCOUNTER — Ambulatory Visit (INDEPENDENT_AMBULATORY_CARE_PROVIDER_SITE_OTHER): Payer: Medicare Other | Admitting: Emergency Medicine

## 2012-10-01 VITALS — BP 132/73 | HR 87 | Temp 98.0°F | Resp 16 | Ht 62.5 in | Wt 125.0 lb

## 2012-10-01 DIAGNOSIS — M5430 Sciatica, unspecified side: Secondary | ICD-10-CM

## 2012-10-01 MED ORDER — CYCLOBENZAPRINE HCL 10 MG PO TABS: 10.0000 mg | ORAL_TABLET | Freq: Three times a day (TID) | ORAL | Status: DC | PRN

## 2012-10-01 MED ORDER — TRAMADOL HCL 50 MG PO TABS: 50.0000 mg | ORAL_TABLET | Freq: Four times a day (QID) | ORAL | Status: DC | PRN

## 2012-10-01 MED ORDER — NAPROXEN SODIUM 550 MG PO TABS: 550.0000 mg | ORAL_TABLET | Freq: Two times a day (BID) | ORAL | Status: AC

## 2012-10-01 NOTE — Patient Instructions (Signed)
Sciatica Sciatica is pain, weakness, numbness, or tingling along the path of the sciatic nerve. The nerve starts in the lower back and runs down the back of each leg. The nerve controls the muscles in the lower leg and in the back of the knee, while also providing sensation to the back of the thigh, lower leg, and the sole of your foot. Sciatica is a symptom of another medical condition. For instance, nerve damage or certain conditions, such as a herniated disk or bone spur on the spine, pinch or put pressure on the sciatic nerve. This causes the pain, weakness, or other sensations normally associated with sciatica. Generally, sciatica only affects one side of the body. CAUSES   Herniated or slipped disc.  Degenerative disk disease.  A pain disorder involving the narrow muscle in the buttocks (piriformis syndrome).  Pelvic injury or fracture.  Pregnancy.  Tumor (rare). SYMPTOMS  Symptoms can vary from mild to very severe. The symptoms usually travel from the low back to the buttocks and down the back of the leg. Symptoms can include:  Mild tingling or dull aches in the lower back, leg, or hip.  Numbness in the back of the calf or sole of the foot.  Burning sensations in the lower back, leg, or hip.  Sharp pains in the lower back, leg, or hip.  Leg weakness.  Severe back pain inhibiting movement. These symptoms may get worse with coughing, sneezing, laughing, or prolonged sitting or standing. Also, being overweight may worsen symptoms. DIAGNOSIS  Your caregiver will perform a physical exam to look for common symptoms of sciatica. He or she may ask you to do certain movements or activities that would trigger sciatic nerve pain. Other tests may be performed to find the cause of the sciatica. These may include:  Blood tests.  X-rays.  Imaging tests, such as an MRI or CT scan. TREATMENT  Treatment is directed at the cause of the sciatic pain. Sometimes, treatment is not necessary  and the pain and discomfort goes away on its own. If treatment is needed, your caregiver may suggest:  Over-the-counter medicines to relieve pain.  Prescription medicines, such as anti-inflammatory medicine, muscle relaxants, or narcotics.  Applying heat or ice to the painful area.  Steroid injections to lessen pain, irritation, and inflammation around the nerve.  Reducing activity during periods of pain.  Exercising and stretching to strengthen your abdomen and improve flexibility of your spine. Your caregiver may suggest losing weight if the extra weight makes the back pain worse.  Physical therapy.  Surgery to eliminate what is pressing or pinching the nerve, such as a bone spur or part of a herniated disk. HOME CARE INSTRUCTIONS   Only take over-the-counter or prescription medicines for pain or discomfort as directed by your caregiver.  Apply ice to the affected area for 20 minutes, 3 4 times a day for the first 48 72 hours. Then try heat in the same way.  Exercise, stretch, or perform your usual activities if these do not aggravate your pain.  Attend physical therapy sessions as directed by your caregiver.  Keep all follow-up appointments as directed by your caregiver.  Do not wear high heels or shoes that do not provide proper support.  Check your mattress to see if it is too soft. A firm mattress may lessen your pain and discomfort. SEEK IMMEDIATE MEDICAL CARE IF:   You lose control of your bowel or bladder (incontinence).  You have increasing weakness in the lower back,   pelvis, buttocks, or legs.  You have redness or swelling of your back.  You have a burning sensation when you urinate.  You have pain that gets worse when you lie down or awakens you at night.  Your pain is worse than you have experienced in the past.  Your pain is lasting longer than 4 weeks.  You are suddenly losing weight without reason. MAKE SURE YOU:  Understand these  instructions.  Will watch your condition.  Will get help right away if you are not doing well or get worse. Document Released: 10/07/2001 Document Revised: 04/13/2012 Document Reviewed: 02/22/2012 [employer] Patient Information 2013 [employer].  

## 2012-10-01 NOTE — Progress Notes (Signed)
  Subjective:    Patient ID: Brandi Woodward, female    DOB: 05-Jul-1961, 51 y.o.   MRN: [id number]  Back Pain This is a chronic problem. The current episode started more than 1 year ago. The problem occurs intermittently. The problem has been waxing and waning since onset. The pain is present in the sacro-iliac. The quality of the pain is described as burning and shooting. The pain radiates to the right knee and right thigh. The pain is moderate. The pain is worse during the day. The symptoms are aggravated by bending, coughing, sitting and standing. Associated symptoms include leg pain. Pertinent negatives include no abdominal pain, bladder incontinence, bowel incontinence, chest pain, dysuria, fever, headaches, numbness, paresis, paresthesias, pelvic pain, perianal numbness, tingling, weakness or weight loss.      Review of Systems  Constitutional: Negative.  Negative for fever and weight loss.  HENT: Negative for rhinorrhea, neck pain, neck stiffness and postnasal drip.   Eyes: Negative.   Respiratory: Negative for apnea, cough, chest tightness and shortness of breath.   Cardiovascular: Negative for chest pain and leg swelling.  Gastrointestinal: Negative.  Negative for abdominal pain and bowel incontinence.  Genitourinary: Negative for bladder incontinence, dysuria, urgency, frequency, difficulty urinating and pelvic pain.  Musculoskeletal: Positive for back pain. Negative for myalgias, joint swelling and gait problem.  Skin: Negative.   Neurological: Negative.  Negative for tingling, weakness, numbness, headaches and paresthesias.       Objective:   Physical Exam  Constitutional: She is oriented to person, place, and time. She appears well-developed and well-nourished.  HENT:  Head: Normocephalic and atraumatic.  Right Ear: External ear normal.  Left Ear: External ear normal.  Mouth/Throat: Oropharynx is clear and moist. No oropharyngeal exudate.  Eyes: Conjunctivae normal and EOM  are normal. Pupils are equal, round, and reactive to light. No scleral icterus.  Neck: Normal range of motion. Neck supple.  Cardiovascular: Normal rate, regular rhythm and normal heart sounds.   Pulmonary/Chest: Effort normal and breath sounds normal. She exhibits no tenderness.  Abdominal: Soft. Bowel sounds are normal. There is no tenderness.  Musculoskeletal: Normal range of motion. She exhibits tenderness (tender right sciatic notch). She exhibits no edema.  Lymphadenopathy:    She has no cervical adenopathy.  Neurological: She is alert and oriented to person, place, and time. She displays normal reflexes. She exhibits normal muscle tone. Coordination normal.  Skin: Skin is warm and dry.          Assessment & Plan:  Long history of low back pain, over 1 1/2 years, with radiation into right leg.  Waxes and wanes but always present.  Will obtain MRI Anaprox Flexeril ultram

## 2012-10-12 ENCOUNTER — Ambulatory Visit (HOSPITAL_COMMUNITY)
Admission: RE | Admit: 2012-10-12 | Discharge: 2012-10-12 | Disposition: A | Discharge status: Home / Self Care | Payer: Medicare Other | Source: Ambulatory Visit | Attending: Emergency Medicine | Admitting: Emergency Medicine

## 2012-10-12 DIAGNOSIS — M47817 Spondylosis without myelopathy or radiculopathy, lumbosacral region: Secondary | ICD-10-CM | POA: Insufficient documentation

## 2012-10-12 DIAGNOSIS — M5430 Sciatica, unspecified side: Secondary | ICD-10-CM | POA: Insufficient documentation

## 2012-10-12 DIAGNOSIS — Q7649 Other congenital malformations of spine, not associated with scoliosis: Secondary | ICD-10-CM | POA: Insufficient documentation

## 2012-10-22 ENCOUNTER — Other Ambulatory Visit: Payer: Self-pay | Admitting: Emergency Medicine

## 2012-10-22 ENCOUNTER — Telehealth: Payer: Self-pay

## 2012-10-22 NOTE — Telephone Encounter (Signed)
Pt is wanting to know if her scan results are back  Best number [phone]

## 2012-10-23 NOTE — Telephone Encounter (Signed)
Please review

## 2012-10-25 NOTE — Telephone Encounter (Signed)
Please let the patient know that I would like to refer her for neurosurgical consultation if she is willing.  I think she would benefit from epidural steroids. thanks

## 2012-10-26 NOTE — Telephone Encounter (Signed)
LMOM to CB. 

## 2012-10-26 NOTE — Telephone Encounter (Signed)
Pt CB and I d/w her the results and referral. Pt agreed to referral and will wait to be contacted.

## 2012-11-18 ENCOUNTER — Other Ambulatory Visit: Payer: Self-pay | Admitting: Emergency Medicine

## 2012-12-09 ENCOUNTER — Other Ambulatory Visit: Payer: Self-pay | Admitting: Physician Assistant

## 2012-12-09 NOTE — Telephone Encounter (Signed)
The patient was referred to neurosurgery.  Refills should come from there.

## 2012-12-10 NOTE — Telephone Encounter (Signed)
Patient called to check on neurosurgery referral, she has not heard from anyone yet about this appt. I am forwarding this to Lupita Leash to check on the referral.  Dr Dareen Piano, can you RF pt's two Rxs until her referral appt?

## 2012-12-24 ENCOUNTER — Other Ambulatory Visit: Payer: Self-pay | Admitting: Emergency Medicine

## 2013-01-19 ENCOUNTER — Other Ambulatory Visit: Payer: Self-pay | Admitting: Emergency Medicine

## 2013-01-19 NOTE — Telephone Encounter (Signed)
Dr Dareen Piano, do you want to RF these for pt?

## 2013-01-29 ENCOUNTER — Ambulatory Visit (INDEPENDENT_AMBULATORY_CARE_PROVIDER_SITE_OTHER): Payer: Medicare Other | Admitting: Emergency Medicine

## 2013-01-29 VITALS — BP 159/83 | HR 86 | Temp 98.0°F | Resp 16 | Ht 62.25 in | Wt 127.0 lb

## 2013-01-29 DIAGNOSIS — M5431 Sciatica, right side: Secondary | ICD-10-CM

## 2013-01-29 DIAGNOSIS — M5430 Sciatica, unspecified side: Secondary | ICD-10-CM

## 2013-01-29 MED ORDER — NAPROXEN SODIUM 550 MG PO TABS: 550.0000 mg | ORAL_TABLET | Freq: Two times a day (BID) | ORAL | Status: AC

## 2013-01-29 NOTE — Progress Notes (Signed)
[facility] [address] [place] [address] [phone]  Date:  01/29/2013   Name:  Brandi Woodward   DOB:  11-Sep-1961   MRN:  [id number]  PCP:  No primary provider on file.    Chief Complaint: Follow-up   History of Present Illness:  Brandi Woodward is a 52 y.o. very pleasant female patient who presents with the following:  Worsened symptoms of pain in right hip with radiation in L3-4 distribution.  No further injury or overuse  No numbness tingling or weakness in leg.  No resolution with OTC medication.  Denies other complaint or health concern today.   Patient Active Problem List  Diagnosis  . Hypertension  . Tobacco use disorder  . Migraines    Past Medical History  Diagnosis Date  . CTS (carpal tunnel syndrome)   . Migraines   . Hypertension 08/2003  . Psychiatric problem 08/2003  . Palpitations   . Tobacco use disorder   . Palpitations     ECHO diagnosed  . Anxiety   . Depression     Past Surgical History  Procedure Laterality Date  . Tonsillectomy and adenoidectomy    . Total abdominal hysterectomy      History  Substance Use Topics  . Smoking status: Current Every Day Smoker -- 0.50 packs/day    Types: Cigarettes  . Smokeless tobacco: Never Used  . Alcohol Use: No    No family history on file.  No Known Allergies  Medication list has been reviewed and updated.  Current Outpatient Prescriptions on File Prior to Visit  Medication Sig Dispense Refill  . albuterol (PROVENTIL HFA;VENTOLIN HFA) 108 (90 BASE) MCG/ACT inhaler Inhale 2 puffs into the lungs every 6 (six) hours as needed for wheezing.  1 Inhaler  0  . cyclobenzaprine (FLEXERIL) 10 MG tablet TAKE 1 TABLET BY MOUTH 3 TIMES A DAY AS NEEDED FOR MUSCLE SPASMS  30 tablet  0  . cyclobenzaprine (FLEXERIL) 5 MG tablet 1 in am, 1 at noon, 2 hs X10d then prn spasm  60 tablet  0  . divalproex (DEPAKOTE ER) 500 MG 24 hr tablet Take 500 mg by mouth 2 (two) times daily.      Marland Kitchen  lamoTRIgine (LAMICTAL) 25 MG tablet Take 25 mg by mouth 2 (two) times daily.      . traMADol (ULTRAM) 50 MG tablet TAKE 1-2 TABLETS (50-100 MG TOTAL) BY MOUTH EVERY 6 (SIX) HOURS AS NEEDED FOR PAIN.  50 tablet  0  . traMADol (ULTRAM) 50 MG tablet TAKE 1 TO 2 TABLETS BY MOUTH EVERY 6 HOURS AS NEEDED FOR PAIN  50 tablet  0  . traMADol (ULTRAM) 50 MG tablet TAKE 1 TO 2 TABLETS BY MOUTH EVERY 6 HOURS AS NEEDED FOR PAIN  50 tablet  0  . DULoxetine (CYMBALTA) 30 MG capsule Take 1 capsule (30 mg total) by mouth daily.  30 capsule  5  . fluticasone (FLONASE) 50 MCG/ACT nasal spray Place 2 sprays into the nose daily.  16 g  6  . lisinopril-hydrochlorothiazide (PRINZIDE,ZESTORETIC) 10-12.5 MG per tablet Take 1 tablet by mouth daily.  90 tablet  3  . naproxen sodium (ANAPROX DS) 550 MG tablet Take 1 tablet (550 mg total) by mouth 2 (two) times daily with a meal.  30 tablet  0  . naproxen sodium (ANAPROX DS) 550 MG tablet Take 1 tablet (550 mg total) by mouth 2 (two) times daily with a meal.  40  tablet  0   No current facility-administered medications on file prior to visit.    Review of Systems:  As per HPI, otherwise negative.    Physical Examination: Filed Vitals:   01/29/13 1318  BP: 159/83  Pulse: 86  Temp: 98 F (36.7 C)  Resp: 16   Filed Vitals:   01/29/13 1318  Height: 5' 2.25" (1.581 m)  Weight: 127 lb (57.607 kg)   Body mass index is 23.05 kg/(m^2). Ideal Body Weight: Weight in (lb) to have BMI = 25: 137.5   GEN: WDWN, NAD, Non-toxic, Alert & Oriented x 3 HEENT: Atraumatic, Normocephalic.  Ears and Nose: No external deformity. EXTR: No clubbing/cyanosis/edema NEURO: Normal gait.  PSYCH: Normally interactive. Conversant. Not depressed or anxious appearing.  Calm demeanor.    Assessment and Plan: Sciatic neuritis Neurosurgery consultation Anaprox   Signed,  Phillips Odor, MD

## 2013-01-29 NOTE — Patient Instructions (Addendum)
Radicular Pain  Radicular pain in either the arm or leg is usually from a bulging or herniated disk in the spine. A piece of the herniated disk may press against the nerves as the nerves exit the spine. This causes pain which is felt at the tips of the nerves down the arm or leg. Other causes of radicular pain may include:   Fractures.   Heart disease.   Cancer.   An abnormal and usually degenerative state of the nervous system or nerves (neuropathy).  Diagnosis may require CT or MRI scanning to determine the primary cause.   Nerves that start at the neck (nerve roots) may cause radicular pain in the outer shoulder and arm. It can spread down to the thumb and fingers. The symptoms vary depending on which nerve root has been affected. In most cases radicular pain improves with conservative treatment. Neck problems may require physical therapy, a neck collar, or cervical traction. Treatment may take many weeks, and surgery may be considered if the symptoms do not improve.   Conservative treatment is also recommended for sciatica. Sciatica causes pain to radiate from the lower back or buttock area down the leg into the foot. Often there is a history of back problems. Most patients with sciatica are better after 2 to 4 weeks of rest and other supportive care. Short term bed rest can reduce the disk pressure considerably. Sitting, however, is not a good position since this increases the pressure on the disk. You should avoid bending, lifting, and all other activities which make the problem worse. Traction can be used in severe cases. Surgery is usually reserved for patients who do not improve within the first months of treatment.  Only take over-the-counter or prescription medicines for pain, discomfort, or fever as directed by your caregiver. Narcotics and muscle relaxants may help by relieving more severe pain and spasm and by providing mild sedation. Cold or massage can give significant relief. Spinal manipulation  is not recommended. It can increase the degree of disc protrusion. Epidural steroid injections are often effective treatment for radicular pain. These injections deliver medicine to the spinal nerve in the space between the protective covering of the spinal cord and back bones (vertebrae). Your caregiver can give you more information about steroid injections. These injections are most effective when given within two weeks of the onset of pain.   You should see your caregiver for follow up care as recommended. A program for neck and back injury rehabilitation with stretching and strengthening exercises is an important part of management.   SEEK IMMEDIATE MEDICAL CARE IF:   You develop increased pain, weakness, or numbness in your arm or leg.   You develop difficulty with bladder or bowel control.   You develop abdominal pain.  Document Released: 11/20/2004 Document Revised: 01/05/2012 Document Reviewed: 02/05/2009  [employer] Patient Information 2013 [employer].

## 2013-02-02 ENCOUNTER — Telehealth: Payer: Self-pay

## 2013-02-02 NOTE — Telephone Encounter (Signed)
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 A CT was recommended to further investigate this and Dr Gerlene Fee wanted to make sure that Dr Dareen Piano was addressing the issue. Dr Dareen Piano, please review.

## 2013-02-07 ENCOUNTER — Telehealth: Payer: Self-pay

## 2013-02-07 DIAGNOSIS — R9389 Abnormal findings on diagnostic imaging of other specified body structures: Secondary | ICD-10-CM

## 2013-02-07 NOTE — Telephone Encounter (Signed)
There is an 18 mm cystic area in or adjacent to the head of the pancreas. 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 Dr Gerlene Fee concerned about this area, wants Dr Dareen Piano to order the scan. I advised patient we will order the scan and call her to set this up. To you FYI, you may disregard previous message [facility] sent you, I have handled it. Amy

## 2013-02-07 NOTE — Telephone Encounter (Signed)
You ordered the scan??  If so great.

## 2013-02-07 NOTE — Telephone Encounter (Signed)
Patient says she would to discuss the cyst that was found on her pancreas please call [phone]

## 2013-02-08 ENCOUNTER — Other Ambulatory Visit: Payer: Self-pay | Admitting: Radiology

## 2013-02-08 DIAGNOSIS — R9389 Abnormal findings on diagnostic imaging of other specified body structures: Secondary | ICD-10-CM

## 2013-02-09 ENCOUNTER — Ambulatory Visit
Admission: RE | Admit: 2013-02-09 | Discharge: 2013-02-09 | Disposition: A | Discharge status: Home / Self Care | Payer: Medicare Other | Source: Ambulatory Visit | Attending: Emergency Medicine | Admitting: Emergency Medicine

## 2013-02-09 DIAGNOSIS — R9389 Abnormal findings on diagnostic imaging of other specified body structures: Secondary | ICD-10-CM

## 2013-02-09 MED ORDER — GADOBENATE DIMEGLUMINE 529 MG/ML IV SOLN
11.0000 mL | Freq: Once | INTRAVENOUS | Status: AC | PRN
  Administered 2013-02-09: 11 mL via INTRAVENOUS

## 2013-02-23 NOTE — Telephone Encounter (Signed)
See 02/07/13 message.

## 2013-08-19 ENCOUNTER — Ambulatory Visit (INDEPENDENT_AMBULATORY_CARE_PROVIDER_SITE_OTHER): Payer: Medicare Other | Admitting: Family Medicine

## 2013-08-19 ENCOUNTER — Ambulatory Visit: Payer: Medicare Other

## 2013-08-19 VITALS — BP 104/68 | HR 84 | Temp 98.7°F | Resp 20 | Ht 62.0 in | Wt 122.6 lb

## 2013-08-19 DIAGNOSIS — R079 Chest pain, unspecified: Secondary | ICD-10-CM

## 2013-08-19 DIAGNOSIS — R059 Cough, unspecified: Secondary | ICD-10-CM

## 2013-08-19 DIAGNOSIS — J209 Acute bronchitis, unspecified: Secondary | ICD-10-CM

## 2013-08-19 DIAGNOSIS — R05 Cough: Secondary | ICD-10-CM

## 2013-08-19 DIAGNOSIS — R062 Wheezing: Secondary | ICD-10-CM

## 2013-08-19 DIAGNOSIS — R0989 Other specified symptoms and signs involving the circulatory and respiratory systems: Secondary | ICD-10-CM

## 2013-08-19 LAB — POCT CBC
Granulocyte percent: 65.5 %G (ref 37–80)
HCT, POC: 43.1 % (ref 37.7–47.9)
Hemoglobin: 13.6 g/dL (ref 12.2–16.2)
Lymph, poc: 2.3 (ref 0.6–3.4)
MCH, POC: 31.9 pg — AB (ref 27–31.2)
MCHC: 31.6 g/dL — AB (ref 31.8–35.4)
MCV: 101 fL — AB (ref 80–97)
MID (cbc): 0.5 (ref 0–0.9)
MPV: 8.6 fL (ref 0–99.8)
POC Granulocyte: 5.4 (ref 2–6.9)
POC LYMPH PERCENT: 28.4 % (ref 10–50)
POC MID %: 6.1 %M (ref 0–12)
Platelet Count, POC: 242 10*3/uL (ref 142–424)
RBC: 4.27 M/uL (ref 4.04–5.48)
RDW, POC: 13.9 %
WBC: 8.2 10*3/uL (ref 4.6–10.2)

## 2013-08-19 MED ORDER — ALBUTEROL SULFATE HFA 108 (90 BASE) MCG/ACT IN AERS: 2.0000 | INHALATION_SPRAY | Freq: Four times a day (QID) | RESPIRATORY_TRACT | Status: DC | PRN

## 2013-08-19 MED ORDER — AZITHROMYCIN 250 MG PO TABS: ORAL_TABLET | ORAL | Status: DC

## 2013-08-19 MED ORDER — IPRATROPIUM BROMIDE 0.02 % IN SOLN: 0.5000 mg | Freq: Once | RESPIRATORY_TRACT | Status: DC

## 2013-08-19 MED ORDER — HYDROCODONE-HOMATROPINE 5-1.5 MG/5ML PO SYRP: 5.0000 mL | ORAL_SOLUTION | Freq: Three times a day (TID) | ORAL | Status: DC | PRN

## 2013-08-19 MED ORDER — ALBUTEROL SULFATE (2.5 MG/3ML) 0.083% IN NEBU: 2.5000 mg | INHALATION_SOLUTION | Freq: Once | RESPIRATORY_TRACT | Status: DC

## 2013-08-19 MED ORDER — METHYLPREDNISOLONE 4 MG PO KIT: PACK | ORAL | Status: DC

## 2013-08-19 NOTE — Progress Notes (Signed)
[facility]:  Office Visit  Chief Complaint:  Chief Complaint  Patient presents with  . Cough  . Sore Throat  . Headache  . Neck Pain  . Chest Pain    HPI: Brandi Woodward is a 52 y.o. female who is here for  Cough, CP with coughing, HA  And neck pain and sore throat Was in the hospital visitng patient in ICU and then started having sxs, this was about 7 days ago She then started having sxs of chest pain , feels like it is raw or on fire in sternum, has a history of GERD in the past  but not similar to GERD,  she has right ear pain and bad HA.  She has been having nasal and chest congestion,She has yellow productive sputum + smoker, denies asthma or allergies  Past Medical History  Diagnosis Date  . CTS (carpal tunnel syndrome)   . Migraines   . Hypertension 08/2003  . Psychiatric problem 08/2003  . Palpitations   . Tobacco use disorder   . Palpitations     ECHO diagnosed  . Anxiety   . Depression    Past Surgical History  Procedure Laterality Date  . Tonsillectomy and adenoidectomy    . Total abdominal hysterectomy     History   Social History  . Marital Status: Married    Spouse Name: N/A    Number of Children: N/A  . Years of Education: N/A   Social History Main Topics  . Smoking status: Current Every Day Smoker -- 0.50 packs/day    Types: Cigarettes  . Smokeless tobacco: Never Used  . Alcohol Use: No  . Drug Use: No  . Sexual Activity: None   Other Topics Concern  . None   Social History Narrative  . None   History reviewed. No pertinent family history. No Known Allergies Prior to Admission medications   Medication Sig Start Date End Date Taking? Authorizing Provider  diazepam (VALIUM) 5 MG tablet Take 5 mg by mouth 2 (two) times daily as needed for anxiety.   Yes Historical Provider, MD  lamoTRIgine (LAMICTAL) 25 MG tablet Take 25 mg by mouth 2 (two) times daily.   Yes Historical Provider, MD  albuterol (PROVENTIL HFA;VENTOLIN  HFA) 108 (90 BASE) MCG/ACT inhaler Inhale 2 puffs into the lungs every 6 (six) hours as needed for wheezing. 02/22/12 02/21/13  Shade Flood, MD  cyclobenzaprine (FLEXERIL) 10 MG tablet TAKE 1 TABLET BY MOUTH 3 TIMES A DAY AS NEEDED FOR MUSCLE SPASMS 01/19/13   Phillips Odor, MD  cyclobenzaprine (FLEXERIL) 5 MG tablet 1 in am, 1 at noon, 2 hs X10d then prn spasm 12/05/11   Anders Simmonds, PA-C  divalproex (DEPAKOTE ER) 500 MG 24 hr tablet Take 500 mg by mouth 2 (two) times daily.    Historical Provider, MD  DULoxetine (CYMBALTA) 30 MG capsule Take 1 capsule (30 mg total) by mouth daily. 12/05/11   Anders Simmonds, PA-C  fluticasone (FLONASE) 50 MCG/ACT nasal spray Place 2 sprays into the nose daily. 12/05/11 12/04/12  Anders Simmonds, PA-C  lisinopril-hydrochlorothiazide (PRINZIDE,ZESTORETIC) 10-12.5 MG per tablet Take 1 tablet by mouth daily. 12/05/11 12/04/12  Anders Simmonds, PA-C  naproxen sodium (ANAPROX DS) 550 MG tablet Take 1 tablet (550 mg total) by mouth 2 (two) times daily with a meal. 10/01/12 10/01/13  Phillips Odor, MD  naproxen sodium (ANAPROX DS) 550 MG tablet Take 1 tablet (550 mg total) by mouth 2 (  two) times daily with a meal. 01/29/13 01/29/14  Phillips Odor, MD  traMADol (ULTRAM) 50 MG tablet TAKE 1-2 TABLETS (50-100 MG TOTAL) BY MOUTH EVERY 6 (SIX) HOURS AS NEEDED FOR PAIN. 11/18/12   Nelva Nay, PA-C  traMADol (ULTRAM) 50 MG tablet TAKE 1 TO 2 TABLETS BY MOUTH EVERY 6 HOURS AS NEEDED FOR PAIN 12/24/12   Phillips Odor, MD  traMADol (ULTRAM) 50 MG tablet TAKE 1 TO 2 TABLETS BY MOUTH EVERY 6 HOURS AS NEEDED FOR PAIN 01/19/13   Phillips Odor, MD     ROS: The patient denies fevers, chills, night sweats, unintentional weight loss, palpitations, , dyspnea on exertion, nausea, vomiting, abdominal pain, dysuria, hematuria, melena, numbness, weakness, or tingling.   All other systems have been reviewed and were otherwise negative with the exception of those mentioned in the HPI and  as above.    PHYSICAL EXAM: Filed Vitals:   08/19/13 1755  BP: 104/68  Pulse: 84  Temp: 98.7 F (37.1 C)  Resp: 20  SPO2  95% Filed Vitals:   08/19/13 1755  Height: 5\' 2"  (1.575 m)  Weight: 122 lb 9.6 oz (55.611 kg)   Body mass index is 22.42 kg/(m^2).  General: Alert, no acute distress HEENT:  Normocephalic, atraumatic, oropharynx patent. EOMI, PERRLA Cardiovascular:  Regular rate and rhythm, no rubs murmurs or gallops.  No Carotid bruits, radial pulse intact. No pedal edema.  Respiratory: Clear to auscultation bilaterally.  + wheezes, no rales, + rhonchi.  No cyanosis, no use of accessory musculature GI: No organomegaly, abdomen is soft and non-tender, positive bowel sounds.  No masses. Skin: No rashes. Neurologic: Facial musculature symmetric. Psychiatric: Patient is appropriate throughout our interaction. Lymphatic: No cervical lymphadenopathy Musculoskeletal: Gait intact.   LABS: Results for orders placed in visit on 08/19/13  POCT CBC      Result Value Range   WBC 8.2  4.6 - 10.2 K/uL   Lymph, poc 2.3  0.6 - 3.4   POC LYMPH PERCENT 28.4  10 - 50 %L   MID (cbc) 0.5  0 - 0.9   POC MID % 6.1  0 - 12 %M   POC Granulocyte 5.4  2 - 6.9   Granulocyte percent 65.5  37 - 80 %G   RBC 4.27  4.04 - 5.48 M/uL   Hemoglobin 13.6  12.2 - 16.2 g/dL   HCT, POC 16.1  09.6 - 47.9 %   MCV 101.0 (*) 80 - 97 fL   MCH, POC 31.9 (*) 27 - 31.2 pg   MCHC 31.6 (*) 31.8 - 35.4 g/dL   RDW, POC 04.5     Platelet Count, POC 242  142 - 424 K/uL   MPV 8.6  0 - 99.8 fL     EKG/XRAY:   Primary read interpreted by Dr. Conley Rolls at [facility]. No acute cardiopulmonary process   ASSESSMENT/PLAN: Encounter Diagnoses  Name Primary?  . Chest pain, unspecified Yes  . Wheezing   . Cough   . Acute bronchitis    Received ebs in office Felt better afterwards, increase air movement decrease wheezes on exam post neb Rx Azithromycin, albuterol inh, hycodan, medrol dose pack F/u prn or go to ER for  worsening sxs Gross sideeffects, risk and benefits, and alternatives of medications d/w patient. Patient is aware that all medications have potential sideeffects and we are unable to predict every sideeffect or drug-drug interaction that may occur.  Hamilton Capri PHUONG, DO 08/19/2013 7:15 PM

## 2013-08-19 NOTE — Patient Instructions (Signed)
Acute Bronchitis You have acute bronchitis. This means you have a chest cold. The airways in your lungs are red and sore (inflamed). Acute means it is sudden onset.  CAUSES Bronchitis is most often caused by the same virus that causes a cold. SYMPTOMS   Body aches.  Chest congestion.  Chills.  Cough.  Fever.  Shortness of breath.  Sore throat. TREATMENT  Acute bronchitis is usually treated with rest, fluids, and medicines for relief of fever or cough. Most symptoms should go away after a few days or a week. Increased fluids may help thin your secretions and will prevent dehydration. Your caregiver may give you an inhaler to improve your symptoms. The inhaler reduces shortness of breath and helps control cough. You can take over-the-counter pain relievers or cough medicine to decrease coughing, pain, or fever. A cool-air vaporizer may help thin bronchial secretions and make it easier to clear your chest. Antibiotics are usually not needed but can be prescribed if you smoke, are seriously ill, have chronic lung problems, are elderly, or you are at higher risk for developing complications.Allergies and asthma can make bronchitis worse. Repeated episodes of bronchitis may cause longstanding lung problems. Avoid smoking and secondhand smoke.Exposure to cigarette smoke or irritating chemicals will make bronchitis worse. If you are a cigarette smoker, consider using nicotine gum or skin patches to help control withdrawal symptoms. Quitting smoking will help your lungs heal faster. Recovery from bronchitis is often slow, but you should start feeling better after 2 to 3 days. Cough from bronchitis frequently lasts for 3 to 4 weeks. To prevent another bout of acute bronchitis:  Quit smoking.  Wash your hands frequently to get rid of viruses or use a hand sanitizer.  Avoid other people with cold or virus symptoms.  Try not to touch your hands to your mouth, nose, or eyes. SEEK IMMEDIATE  MEDICAL CARE IF:  You develop increased fever, chills, or chest pain.  You have severe shortness of breath or bloody sputum.  You develop dehydration, fainting, repeated vomiting, or a severe headache.  You have no improvement after 1 week of treatment or you get worse. MAKE SURE YOU:   Understand these instructions.  Will watch your condition.  Will get help right away if you are not doing well or get worse. Document Released: 11/20/2004 Document Revised: 01/05/2012 Document Reviewed: 02/05/2011 ExitCare Patient Information 2014 [employer].  

## 2014-05-19 ENCOUNTER — Encounter: Payer: Medicare Other | Admitting: Family Medicine

## 2014-07-04 ENCOUNTER — Ambulatory Visit (INDEPENDENT_AMBULATORY_CARE_PROVIDER_SITE_OTHER): Payer: Medicare Other | Admitting: Family Medicine

## 2014-07-04 VITALS — BP 160/82 | HR 70 | Temp 98.1°F | Resp 16 | Ht 62.0 in | Wt 121.1 lb

## 2014-07-04 DIAGNOSIS — R609 Edema, unspecified: Secondary | ICD-10-CM

## 2014-07-04 DIAGNOSIS — I10 Essential (primary) hypertension: Secondary | ICD-10-CM

## 2014-07-04 LAB — POCT CBC
Granulocyte percent: 56 %G (ref 37–80)
HCT, POC: 44.2 % (ref 37.7–47.9)
Hemoglobin: 14.1 g/dL (ref 12.2–16.2)
Lymph, poc: 2.4 (ref 0.6–3.4)
MCH, POC: 31 pg (ref 27–31.2)
MCHC: 31.9 g/dL (ref 31.8–35.4)
MCV: 97 fL (ref 80–97)
MID (cbc): 0.3 (ref 0–0.9)
MPV: 7.3 fL (ref 0–99.8)
POC Granulocyte: 3.4 (ref 2–6.9)
POC LYMPH PERCENT: 39.6 %L (ref 10–50)
POC MID %: 4.4 % (ref 0–12)
Platelet Count, POC: 250 10*3/uL (ref 142–424)
RBC: 4.56 M/uL (ref 4.04–5.48)
RDW, POC: 15.6 %
WBC: 6.1 10*3/uL (ref 4.6–10.2)

## 2014-07-04 LAB — POCT URINALYSIS DIPSTICK
Bilirubin, UA: NEGATIVE
Blood, UA: NEGATIVE
Glucose, UA: NEGATIVE
Ketones, UA: 15
Leukocytes, UA: NEGATIVE
Nitrite, UA: NEGATIVE
Protein, UA: NEGATIVE
Spec Grav, UA: 1.015
Urobilinogen, UA: 0.2
pH, UA: 7

## 2014-07-04 LAB — POCT UA - MICROSCOPIC ONLY
Bacteria, U Microscopic: NEGATIVE
Casts, Ur, LPF, POC: NEGATIVE
Crystals, Ur, HPF, POC: NEGATIVE
Mucus, UA: NEGATIVE
Yeast, UA: NEGATIVE

## 2014-07-04 MED ORDER — LISINOPRIL-HYDROCHLOROTHIAZIDE 10-12.5 MG PO TABS: 1.0000 | ORAL_TABLET | Freq: Every day | ORAL | Status: DC

## 2014-07-04 NOTE — Patient Instructions (Signed)

## 2014-07-04 NOTE — Progress Notes (Signed)
  Brandi Woodward - 53 y.o. female MRN [id number]  Date of birth: [birth year]  SUBJECTIVE:  Including CC & ROS.  The patient presents for evaluation of: Lower Extremity Swelling: Reports 24 hours of worsening LE edema.  Painful over bilateral ankles and knees. Denies recent immobilization or prolonged trip.  Did have a family cookout over the weekend with worsening following this event.  Similar swelling multiple times intermittently over the past couple of months. Has been elevating legs by resting feet/knees over a pillow while laying with feet hanging off of pillow/edge of bed.  Denies chest pain, shortness of breath, cough/hemoptysis.  No prior VTE.  Has discontinued ACE/HCTZ as she didn't feel like it was helping.  Denies DOE, cough, weight loss, fevers, tachypalpitations, orth stasis, PND or orthopnea.   HISTORY: Past Medical, Surgical, Social, and Family History Reviewed & Updated per EMR. Pertinent Historical Findings include: Hx of BiPolar disorder - self d/c meds in July.  Reports otherwise being healthy and not taking any medications. Per Problem list - HTN, Migraines, palpitations with recurrent chest pain  OBJECTIVE FINDINGS:  VS:  HT:5\' 2"  (157.5 cm)   WT:121 lb 2 oz (54.942 kg)  BMI:22.2          BP:160/82 mmHg  HR:70bpm  TEMP:98.1 F (36.7 C)(Oral)  RESP:98 %  PHYSICAL EXAM:            GENERAL:  Adult thin caucasian female. In no discomfort; no respiratory distress                PSYCH:  alert and appropriate, moderate insight      HNEENT:  mmm, no JVD, no thyromegaly, no lymphadenopathy    CARDIAC:  RRR, S1/S2 heard, no murmur       LUNGS:  CTA B, no wheezes, no crackles     EXTREM:  Warm, well perfused.  Moves all 4 extremities spontaneously; no lateralization.  Pedal pulses 2+/4.  2-3+/4 pretibial edema pitting up to mid shin bilaterally.  No cords appreciated; negative homan's bilaterally  ASSESSMENT: 1. Edema   2. Essential hypertension    Likely secondary to  medication non-compliance and increased salt intake with likely venous insufficency. No concerns for DVT by history.  No evidence of frank volume overload and no JVD.  No hx of CHF.    PLAN: See problem based charting & AVS for additional documentation. - Re-start Zestoretic; Elevate feet above level of heart; avoid acute bends at the knee when "elevating" - consider compression socks if recurrent -Encouraged to follow up with Psychiatrist to restart BiPolar medication. - Labs today reviewed; CMET pending. > Follow up in Appointment clinic to further manage chronic medical conditions and follow up health care maintenance including screening colonoscopy, mammogram, smoking cessation.  Discussed these with pt today and she declines referrals at this time.  I agree with A/P above . Dr Colen Darling 07/05/14

## 2014-07-05 LAB — COMPREHENSIVE METABOLIC PANEL
AST: 16 U/L (ref 0–37)
Albumin: 4.3 g/dL (ref 3.5–5.2)
Alkaline Phosphatase: 65 U/L (ref 39–117)
BUN: 12 mg/dL (ref 6–23)
Creat: 0.55 mg/dL (ref 0.50–1.10)
Glucose, Bld: 81 mg/dL (ref 70–99)
Potassium: 3.7 mEq/L (ref 3.5–5.3)
Total Bilirubin: 0.2 mg/dL (ref 0.2–1.2)

## 2014-07-05 LAB — COMPREHENSIVE METABOLIC PANEL WITH GFR
ALT: 8 U/L (ref 0–35)
CO2: 26 meq/L (ref 19–32)
Calcium: 8.8 mg/dL (ref 8.4–10.5)
Chloride: 110 meq/L (ref 96–112)
Sodium: 145 meq/L (ref 135–145)
Total Protein: 6.3 g/dL (ref 6.0–8.3)

## 2014-07-20 ENCOUNTER — Telehealth: Payer: Self-pay

## 2014-07-28 ENCOUNTER — Encounter: Payer: Self-pay | Admitting: Family Medicine

## 2014-07-28 ENCOUNTER — Ambulatory Visit (INDEPENDENT_AMBULATORY_CARE_PROVIDER_SITE_OTHER): Payer: Medicare Other | Admitting: Family Medicine

## 2014-07-28 VITALS — BP 158/84 | HR 78 | Temp 98.5°F | Resp 16 | Ht 62.5 in | Wt 116.0 lb

## 2014-07-28 DIAGNOSIS — R5383 Other fatigue: Secondary | ICD-10-CM

## 2014-07-28 DIAGNOSIS — F329 Major depressive disorder, single episode, unspecified: Secondary | ICD-10-CM

## 2014-07-28 DIAGNOSIS — R634 Abnormal weight loss: Secondary | ICD-10-CM

## 2014-07-28 DIAGNOSIS — F419 Anxiety disorder, unspecified: Secondary | ICD-10-CM

## 2014-07-28 DIAGNOSIS — F3170 Bipolar disorder, currently in remission, most recent episode unspecified: Secondary | ICD-10-CM

## 2014-07-28 DIAGNOSIS — Z23 Encounter for immunization: Secondary | ICD-10-CM

## 2014-07-28 DIAGNOSIS — I10 Essential (primary) hypertension: Secondary | ICD-10-CM

## 2014-07-28 DIAGNOSIS — F418 Other specified anxiety disorders: Secondary | ICD-10-CM

## 2014-07-28 DIAGNOSIS — F32A Depression, unspecified: Secondary | ICD-10-CM

## 2014-07-28 LAB — COMPLETE METABOLIC PANEL WITH GFR
ALT: 8 U/L (ref 0–35)
CO2: 25 mEq/L (ref 19–32)
Calcium: 8.6 mg/dL (ref 8.4–10.5)
Chloride: 107 mEq/L (ref 96–112)
Creat: 0.52 mg/dL (ref 0.50–1.10)
GFR, Est African American: >89 mL/min
GFR, Est Non African American: >89 mL/min
Glucose, Bld: 82 mg/dL (ref 70–99)
Sodium: 143 mEq/L (ref 135–145)
Total Protein: 6.1 g/dL (ref 6.0–8.3)

## 2014-07-28 LAB — CBC
HCT: 43.3 % (ref 36.0–46.0)
Hemoglobin: 14.9 g/dL (ref 12.0–15.0)
MCH: 30.8 pg (ref 26.0–34.0)
MCHC: 34.4 g/dL (ref 30.0–36.0)
MCV: 89.5 fL (ref 78.0–100.0)
Platelets: 276 10*3/uL (ref 150–400)
RBC: 4.84 MIL/uL (ref 3.87–5.11)
RDW: 14.2 % (ref 11.5–15.5)
WBC: 5.9 10*3/uL (ref 4.0–10.5)

## 2014-07-28 LAB — COMPLETE METABOLIC PANEL WITHOUT GFR
AST: 15 U/L (ref 0–37)
Albumin: 4.2 g/dL (ref 3.5–5.2)
Alkaline Phosphatase: 68 U/L (ref 39–117)
BUN: 11 mg/dL (ref 6–23)
Potassium: 4.2 meq/L (ref 3.5–5.3)
Total Bilirubin: 0.2 mg/dL (ref 0.2–1.2)

## 2014-07-28 LAB — TSH: TSH: 1.073 u[IU]/mL (ref 0.350–4.500)

## 2014-07-28 MED ORDER — DIAZEPAM 5 MG PO TABS: 5.0000 mg | ORAL_TABLET | Freq: Two times a day (BID) | ORAL | Status: DC | PRN

## 2014-07-28 MED ORDER — HYDROCHLOROTHIAZIDE 12.5 MG PO CAPS: 12.5000 mg | ORAL_CAPSULE | Freq: Every day | ORAL | Status: DC

## 2014-07-28 NOTE — Progress Notes (Signed)
Chief Complaint:  Chief Complaint  Patient presents with  . Hypertension    stopped taking medication due to dizziness  . Otalgia    right    HPI: Brandi Woodward is a 53 y.o. female who is here for : 1. HTN-Stopped taking her HTN meds due to dizziness, she was dizzy all the time espec with sitting and standing  Positional changes. Was on lisinopril 10/Hctz 12.5 mg  2. She has right ear pain but it is behind her ear, she has no fevers or chills, ringing or hearing loss, it is not in the ear but behind the ear 3. She has been off of her bipolar and anxiety meds for 1.5 months now, she has no withdrawal sxs, she was seeing Dr Milana Kidney but felt the medicines were too strong and did not like how he never spoke to her but just said things in the 3rd perosn, she tried asking him to decrease her med doses but he would not listen according to the patient.  Per patient he stated she needed them. She currently has no SI/HI./halluciantions 4. She has anxiety and would like to be on her Valium again, she is only taking it BID prn; she denies abuse issues.    Past Medical History  Diagnosis Date  . CTS (carpal tunnel syndrome)   . Migraines   . Hypertension 08/2003  . Psychiatric problem 08/2003  . Palpitations   . Tobacco use disorder   . Palpitations     ECHO diagnosed  . Anxiety   . Depression   . Bipolar 1 disorder     was being followed by Dr Milana Kidney   Past Surgical History  Procedure Laterality Date  . Tonsillectomy and adenoidectomy    . Total abdominal hysterectomy     History   Social History  . Marital Status: Married    Spouse Name: N/A    Number of Children: N/A  . Years of Education: N/A   Social History Main Topics  . Smoking status: Current Every Day Smoker -- 0.50 packs/day    Types: Cigarettes  . Smokeless tobacco: Never Used  . Alcohol Use: No  . Drug Use: No  . Sexual Activity: None   Other Topics Concern  . None   Social History Narrative    . None   No family history on file. No Known Allergies Prior to Admission medications   Medication Sig Start Date End Date Taking? Authorizing Provider  albuterol (PROVENTIL HFA;VENTOLIN HFA) 108 (90 BASE) MCG/ACT inhaler Inhale 2 puffs into the lungs every 6 (six) hours as needed for wheezing. 08/19/13   Thao P Le, DO  cyclobenzaprine (FLEXERIL) 10 MG tablet TAKE 1 TABLET BY MOUTH 3 TIMES A DAY AS NEEDED FOR MUSCLE SPASMS 01/19/13   Carmelina Dane, MD  cyclobenzaprine (FLEXERIL) 5 MG tablet 1 in am, 1 at noon, 2 hs X10d then prn spasm 12/05/11   Anders Simmonds, PA-C  diazepam (VALIUM) 5 MG tablet Take 5 mg by mouth 2 (two) times daily as needed for anxiety.    Historical Provider, MD  divalproex (DEPAKOTE ER) 500 MG 24 hr tablet Take 500 mg by mouth 2 (two) times daily.    Historical Provider, MD  DULoxetine (CYMBALTA) 30 MG capsule Take 1 capsule (30 mg total) by mouth daily. 12/05/11   Anders Simmonds, PA-C  fluticasone (FLONASE) 50 MCG/ACT nasal spray Place 2 sprays into the nose daily. 12/05/11 12/04/12  Marzella Schlein  McClung, PA-C  lamoTRIgine (LAMICTAL) 25 MG tablet Take 25 mg by mouth 2 (two) times daily.    Historical Provider, MD  lisinopril-hydrochlorothiazide (PRINZIDE,ZESTORETIC) 10-12.5 MG per tablet Take 1 tablet by mouth daily. 07/04/14 07/04/15  Andrena Mews, DO  traMADol (ULTRAM) 50 MG tablet TAKE 1-2 TABLETS (50-100 MG TOTAL) BY MOUTH EVERY 6 (SIX) HOURS AS NEEDED FOR PAIN. 11/18/12   Nelva Nay, PA-C     ROS: The patient denies fevers, chills, night sweats, unintentional weight loss, chest pain, palpitations, wheezing, dyspnea on exertion, nausea, vomiting, abdominal pain, dysuria, hematuria, melena, numbness, weakness, or tingling.   All other systems have been reviewed and were otherwise negative with the exception of those mentioned in the HPI and as above.    PHYSICAL EXAM: Filed Vitals:   07/28/14 1110  BP: 158/84  Pulse: 78  Temp: 98.5 F (36.9 C)  Resp: 16    Filed Vitals:   07/28/14 1110  Height: 5' 2.5" (1.588 m)  Weight: 116 lb (52.617 kg)   Body mass index is 20.87 kg/(m^2).  General: Alert, no acute distress HEENT:  Normocephalic, atraumatic, oropharynx patent. EOMI, PERRLA, right TM nl, minimal tender behind right ear, possible small LAD but not really suspect that, she ahs no TMJ Cardiovascular:  Regular rate and rhythm, no rubs murmurs or gallops.  No Carotid bruits, radial pulse intact. No pedal edema.  Respiratory: Clear to auscultation bilaterally.  No wheezes, rales, or rhonchi.  No cyanosis, no use of accessory musculature GI: No organomegaly, abdomen is soft and non-tender, positive bowel sounds.  No masses. Skin: No rashes. Neurologic: Facial musculature symmetric. Psychiatric: Patient is appropriate throughout our interaction.  Thought process is nl, she is vary clear in her understanding and is lucid, speech is nl, she has no mania or depressed sxs that are exhbiting in office  Lymphatic: No cervical lymphadenopathy Musculoskeletal: Gait intact.   LABS: Results for orders placed in visit on 07/04/14  COMPREHENSIVE METABOLIC PANEL      Result Value Ref Range   Sodium 145  135 - 145 mEq/L   Potassium 3.7  3.5 - 5.3 mEq/L   Chloride 110  96 - 112 mEq/L   CO2 26  19 - 32 mEq/L   Glucose, Bld 81  70 - 99 mg/dL   BUN 12  6 - 23 mg/dL   Creat 1.61  0.96 - 0.45 mg/dL   Total Bilirubin 0.2  0.2 - 1.2 mg/dL   Alkaline Phosphatase 65  39 - 117 U/L   AST 16  0 - 37 U/L   ALT 8  0 - 35 U/L   Total Protein 6.3  6.0 - 8.3 g/dL   Albumin 4.3  3.5 - 5.2 g/dL   Calcium 8.8  8.4 - 40.9 mg/dL  POCT CBC      Result Value Ref Range   WBC 6.1  4.6 - 10.2 K/uL   Lymph, poc 2.4  0.6 - 3.4   POC LYMPH PERCENT 39.6  10 - 50 %L   MID (cbc) 0.3  0 - 0.9   POC MID % 4.4  0 - 12 %M   POC Granulocyte 3.4  2 - 6.9   Granulocyte percent 56.0  37 - 80 %G   RBC 4.56  4.04 - 5.48 M/uL   Hemoglobin 14.1  12.2 - 16.2 g/dL   HCT, POC 81.1   91.4 - 47.9 %   MCV 97.0  80 - 97 fL   MCH,  POC 31.0  27 - 31.2 pg   MCHC 31.9  31.8 - 35.4 g/dL   RDW, POC 78.215.6     Platelet Count, POC 250  142 - 424 K/uL   MPV 7.3  0 - 99.8 fL  POCT UA - MICROSCOPIC ONLY      Result Value Ref Range   WBC, Ur, HPF, POC 1-3     RBC, urine, microscopic 0-1     Bacteria, U Microscopic negative     Mucus, UA negative     Epithelial cells, urine per micros 0-1     Crystals, Ur, HPF, POC negative     Casts, Ur, LPF, POC negative     Yeast, UA negative     Other renal tubular    POCT URINALYSIS DIPSTICK      Result Value Ref Range   Color, UA yellow     Clarity, UA clear     Glucose, UA neg     Bilirubin, UA neg     Ketones, UA 15     Spec Grav, UA 1.015     Blood, UA neg     pH, UA 7.0     Protein, UA neg     Urobilinogen, UA 0.2     Nitrite, UA neg     Leukocytes, UA Negative       EKG/XRAY:   Primary [facility]   ASSESSMENT/PLAN: Encounter Diagnoses  Name Primary?  . Needs flu shot   . Other fatigue Yes  . Weight loss   . Anxiety and depression   . Essential hypertension   . Bipolar disorder in full remission, most recent episode unspecified type    Referred her to psych for better management of bipolar I disorder, she needs to be back on meds but she is refusing to take them, she was given phones numbers to call since  I cannot make the referral myself but [facility] and also [facility] I rerx her valium for anxiety and the this is only contingent on her getting in to see another psychiatrist if she was not happy with the one she already has Stop lisinopril/hctz Will only do hctz 12.5 mg , she will call me with BP results in 1 week, goal is less than 150/90 She has increase urination with the hctz so would consideradding back lisinopril but at low dose,  lisiopril 5 mg if need to increase BP meds for better control F./u in 1 month Basic labs pending Flu vaccine  given  Gross sideeffects, risk and benefits, and alternatives of medications d/w patient. Patient is aware that all medications have potential sideeffects and we are unable to predict every sideeffect or drug-drug interaction that may occur.  LE, THAO PHUONG, DO 07/28/2014 2:52 PM

## 2014-07-31 ENCOUNTER — Telehealth: Payer: Self-pay

## 2014-07-31 DIAGNOSIS — F32A Depression, unspecified: Secondary | ICD-10-CM | POA: Insufficient documentation

## 2014-07-31 DIAGNOSIS — F419 Anxiety disorder, unspecified: Secondary | ICD-10-CM | POA: Insufficient documentation

## 2014-07-31 DIAGNOSIS — F329 Major depressive disorder, single episode, unspecified: Secondary | ICD-10-CM | POA: Insufficient documentation

## 2014-07-31 DIAGNOSIS — F319 Bipolar disorder, unspecified: Secondary | ICD-10-CM | POA: Insufficient documentation

## 2014-07-31 NOTE — Telephone Encounter (Signed)
Advised pt to give the blood pressure medication a few days to work. She is going to take her BP at home daily for the next 2 weeks and give us a call with an update.

## 2014-07-31 NOTE — Telephone Encounter (Signed)
Dr. Conley RollsLe,   Patient wanted to ask how long till the blood pressure medication starts to work? Please advise thank you.  Patient also wanted to let you know she is currently going to schedule with [facility]- they closed for lunch at this moment.

## 2014-08-07 ENCOUNTER — Telehealth: Payer: Self-pay | Admitting: *Deleted

## 2014-08-07 ENCOUNTER — Telehealth: Payer: Self-pay

## 2014-08-07 NOTE — Telephone Encounter (Signed)
Pt called in stating she had just missed Dr. Irwin BrakemanLe's phone call and she wanted to let her know what her blood pressures had been for the past week. I told her that I could take those readings and let the doctor know what they were.  08/03/14--139/84 Pulse--90 08/04/14--136/66 pulse---82 08/05/14--134/76 pulse---84 08/06/14--130/81 pulse--100 08/07/14--149/72 pulse--97  I let the patient know that if the doctor needed to talk to her she would call her back. She stated that was fine and that she was going to take another week of blood pressures to let the doctor know as well.  Her call back number is [phone]

## 2014-08-07 NOTE — Telephone Encounter (Signed)
LM about labs, she was supposed to call me back about her BP results.

## 2014-08-07 NOTE — Telephone Encounter (Signed)
Pt called concerning lab results from 10/2. Doesn't look like they have been reviewed. Please advise.

## 2014-08-18 ENCOUNTER — Ambulatory Visit (INDEPENDENT_AMBULATORY_CARE_PROVIDER_SITE_OTHER): Payer: Medicare Other | Admitting: Family Medicine

## 2014-08-18 VITALS — BP 122/74 | HR 85 | Temp 98.2°F | Resp 17 | Ht 62.0 in | Wt 115.0 lb

## 2014-08-18 DIAGNOSIS — F319 Bipolar disorder, unspecified: Secondary | ICD-10-CM

## 2014-08-18 DIAGNOSIS — K047 Periapical abscess without sinus: Secondary | ICD-10-CM

## 2014-08-18 MED ORDER — AMOXICILLIN 875 MG PO TABS: 875.0000 mg | ORAL_TABLET | Freq: Two times a day (BID) | ORAL | Status: DC

## 2014-08-18 MED ORDER — DIAZEPAM 5 MG PO TABS: 5.0000 mg | ORAL_TABLET | Freq: Two times a day (BID) | ORAL | Status: DC | PRN

## 2014-08-18 NOTE — Progress Notes (Signed)
Subjective:  This chart was scribed for Brandi Woodward by Haywood PaoNadim Abu Hashem, ED Scribe. The patient was seen in Exam room 09 and the patient's care was started at 12:41 PM.   Patient ID: Brandi Woodward, female    DOB: 1960-11-12, 53 y.o.   MRN: [id number]  HPI HPI Comments: Brandi Alalice G Campusano is a 53 y.o. female  With a history of Bipolar I who [facility] complaining of right ear swelling. She has pain when she eats and her jaw and ear swell up. She says the bottom of her eyes and cheeks have some swelling as well.  She says when she wakes up in the morning her ear hurts. She notes drinking a lot of water and using a heating pad for her pain and swelling which provides some relief. Pt has some nasal congestion and she takes flonase which provides some mild relief. She takes novocaine but notes it increases her heart rate. Pt denies any facial injury. Pt says when she was young she broke her nose. Her psychiatrist is Dr. Kimber Relicunningham.She bought 3 cars in a short period and new clothes to match her new cars. Pt does not work and has [employer] through [employer].  Patient Active Problem List   Diagnosis Date Noted   Bipolar affective disorder 07/31/2014   Anxiety and depression 07/31/2014   Tobacco use disorder    Migraines    Hypertension 08/28/2003   Past Medical History  Diagnosis Date   CTS (carpal tunnel syndrome)    Migraines    Hypertension 08/2003   Psychiatric problem 08/2003   Palpitations    Tobacco use disorder    Palpitations     ECHO diagnosed   Anxiety    Depression    Bipolar 1 disorder     was being followed by Dr Milana Kidneyunnigham   Past Surgical History  Procedure Laterality Date   Tonsillectomy and adenoidectomy     Total abdominal hysterectomy     No Known Allergies Prior to Admission medications   Medication Sig Start Date End Date Taking? Authorizing Provider  hydrochlorothiazide (MICROZIDE) 12.5 MG capsule Take 1 capsule (12.5 mg total) by  mouth daily. 07/28/14  Yes Thao P Le, DO  albuterol (PROVENTIL HFA;VENTOLIN HFA) 108 (90 BASE) MCG/ACT inhaler Inhale 2 puffs into the lungs every 6 (six) hours as needed for wheezing. 08/19/13   Thao P Le, DO  diazepam (VALIUM) 5 MG tablet Take 1 tablet (5 mg total) by mouth every 12 (twelve) hours as needed for anxiety. 07/28/14   Thao Dayna RamusP Le, DO   History   Social History   Marital Status: Married    Spouse Name: N/A    Number of Children: N/A   Years of Education: N/A   Occupational History   Not on file.   Social History Main Topics   Smoking status: Current Every Day Smoker -- 0.50 packs/day for 31 years    Types: Cigarettes   Smokeless tobacco: Never Used   Alcohol Use: No   Drug Use: No   Sexual Activity: Not on file   Other Topics Concern   Not on file   Social History Narrative   No narrative on file    Review of Systems  HENT: Positive for dental problem, ear pain, facial swelling and sinus pressure.   All other systems reviewed and are negative.      Objective:   Physical Exam  Nursing note and vitals reviewed. Constitutional: She is oriented to  person, place, and time. She appears well-developed and well-nourished. No distress.  HENT:  Head: Normocephalic and atraumatic.  Mild swelling and retained of tooth number 31.   Eyes: EOM are normal.  Neck: Normal range of motion.  Swelling of the right posterior auricular node  Cardiovascular: Normal rate.   Pulmonary/Chest: Effort normal.  Musculoskeletal: Normal range of motion.  Neurological: She is alert and oriented to person, place, and time.  Skin: Skin is warm and dry.  Psychiatric: She has a normal mood and affect. Her behavior is normal.    BP 122/74   Pulse 85   Temp(Src) 98.2 F (36.8 C) (Oral)   Resp 17   Ht 5\' 2"  (1.575 m)   Wt 115 lb (52.164 kg)   BMI 21.03 kg/m2   SpO2 98%      Assessment & Plan:  I personally performed the services described in this documentation, which was  scribed in my presence. The recorded information has been reviewed and is accurate.  Bipolar I disorder, most recent episode (or current) unspecified - Plan: diazepam (VALIUM) 5 MG tablet  Dental abscess - Plan: amoxicillin (AMOXIL) 875 MG tablet, Ambulatory referral to Oral Maxillofacial Surgery  Signed, Brandi SidleKurt Lauenstein, MD

## 2014-08-18 NOTE — Patient Instructions (Signed)
Call Dr. Phillip HealJane Steiner, MD Take the amoxicillin twice a day Valium has been refilled We're referring you to an oral surgeon

## 2014-09-02 ENCOUNTER — Other Ambulatory Visit: Payer: Self-pay | Admitting: Family Medicine

## 2014-09-05 NOTE — Telephone Encounter (Signed)
Faxed

## 2014-09-24 ENCOUNTER — Other Ambulatory Visit: Payer: Self-pay | Admitting: Family Medicine

## 2014-10-25 ENCOUNTER — Other Ambulatory Visit: Payer: Self-pay | Admitting: Physician Assistant

## 2014-11-07 NOTE — Telephone Encounter (Signed)
Old mssg °

## 2014-12-05 ENCOUNTER — Other Ambulatory Visit (HOSPITAL_COMMUNITY): Payer: Self-pay | Admitting: Internal Medicine

## 2014-12-05 ENCOUNTER — Ambulatory Visit (HOSPITAL_COMMUNITY)
Admission: RE | Admit: 2014-12-05 | Discharge: 2014-12-05 | Disposition: A | Discharge status: Home / Self Care | Payer: Medicare (Managed Care) | Source: Ambulatory Visit | Attending: Internal Medicine | Admitting: Internal Medicine

## 2014-12-05 DIAGNOSIS — R071 Chest pain on breathing: Secondary | ICD-10-CM

## 2014-12-05 DIAGNOSIS — R079 Chest pain, unspecified: Secondary | ICD-10-CM | POA: Insufficient documentation

## 2014-12-05 DIAGNOSIS — W19XXXA Unspecified fall, initial encounter: Secondary | ICD-10-CM | POA: Diagnosis not present

## 2014-12-05 DIAGNOSIS — M25519 Pain in unspecified shoulder: Secondary | ICD-10-CM | POA: Diagnosis not present

## 2014-12-20 ENCOUNTER — Encounter: Payer: Self-pay | Admitting: Gastroenterology

## 2014-12-20 ENCOUNTER — Telehealth: Payer: Self-pay | Admitting: Internal Medicine

## 2014-12-20 NOTE — Telephone Encounter (Signed)
A user error has taken place.

## 2014-12-28 ENCOUNTER — Other Ambulatory Visit: Payer: Self-pay | Admitting: Obstetrics and Gynecology

## 2014-12-28 DIAGNOSIS — Z1231 Encounter for screening mammogram for malignant neoplasm of breast: Secondary | ICD-10-CM

## 2015-01-08 ENCOUNTER — Ambulatory Visit
Admission: RE | Admit: 2015-01-08 | Discharge: 2015-01-08 | Disposition: A | Discharge status: Home / Self Care | Payer: 59 | Source: Ambulatory Visit | Attending: Obstetrics and Gynecology | Admitting: Obstetrics and Gynecology

## 2015-01-08 DIAGNOSIS — Z1231 Encounter for screening mammogram for malignant neoplasm of breast: Secondary | ICD-10-CM

## 2015-01-11 ENCOUNTER — Other Ambulatory Visit: Payer: Self-pay | Admitting: Obstetrics and Gynecology

## 2015-01-11 DIAGNOSIS — R928 Other abnormal and inconclusive findings on diagnostic imaging of breast: Secondary | ICD-10-CM

## 2015-01-16 ENCOUNTER — Ambulatory Visit
Admission: RE | Admit: 2015-01-16 | Discharge: 2015-01-16 | Disposition: A | Discharge status: Home / Self Care | Payer: 59 | Source: Ambulatory Visit | Attending: Obstetrics and Gynecology | Admitting: Obstetrics and Gynecology

## 2015-01-16 DIAGNOSIS — R928 Other abnormal and inconclusive findings on diagnostic imaging of breast: Secondary | ICD-10-CM

## 2015-01-31 ENCOUNTER — Encounter: Payer: Self-pay | Admitting: *Deleted

## 2015-02-05 ENCOUNTER — Ambulatory Visit (AMBULATORY_SURGERY_CENTER): Payer: Self-pay | Admitting: *Deleted

## 2015-02-05 VITALS — Ht 62.5 in | Wt 125.0 lb

## 2015-02-05 DIAGNOSIS — Z1211 Encounter for screening for malignant neoplasm of colon: Secondary | ICD-10-CM

## 2015-02-05 MED ORDER — NA SULFATE-K SULFATE-MG SULF 17.5-3.13-1.6 GM/177ML PO SOLN: ORAL | Status: AC

## 2015-02-05 NOTE — Progress Notes (Signed)
Patient denies any allergies to eggs or soy. Patient denies any problems with anesthesia/sedation. Patient denies any oxygen use at home and does not take any diet/weight loss medications. EMMI education assisgned to patient on colonoscopy, this was explained and instructions given to patient. 

## 2015-02-19 ENCOUNTER — Ambulatory Visit (AMBULATORY_SURGERY_CENTER): Payer: 59 | Admitting: Gastroenterology

## 2015-02-19 ENCOUNTER — Encounter: Payer: Self-pay | Admitting: Gastroenterology

## 2015-02-19 VITALS — BP 137/83 | HR 70 | Temp 97.4°F | Resp 13 | Ht 62.5 in | Wt 125.0 lb

## 2015-02-19 DIAGNOSIS — Z1211 Encounter for screening for malignant neoplasm of colon: Secondary | ICD-10-CM

## 2015-02-19 MED ORDER — SODIUM CHLORIDE 0.9 % IV SOLN: 500.0000 mL | INTRAVENOUS | Status: DC

## 2015-02-19 NOTE — Patient Instructions (Signed)
YOU HAD AN ENDOSCOPIC PROCEDURE TODAY AT THE [facility]:   Refer to the procedure report that was given to you for any specific questions about what was found during the examination.  If the procedure report does not answer your questions, please call your gastroenterologist to clarify.  If you requested that your care partner not be given the details of your procedure findings, then the procedure report has been included in a sealed envelope for you to review at your convenience later.  YOU SHOULD EXPECT: Some feelings of bloating in the abdomen. Passage of more gas than usual.  Walking can help get rid of the air that was put into your GI tract during the procedure and reduce the bloating. If you had a lower endoscopy (such as a colonoscopy or flexible sigmoidoscopy) you may notice spotting of blood in your stool or on the toilet paper. If you underwent a bowel prep for your procedure, you may not have a normal bowel movement for a few days.  Please Note:  You might notice some irritation and congestion in your nose or some drainage.  This is from the oxygen used during your procedure.  There is no need for concern and it should clear up in a day or so.  SYMPTOMS TO REPORT IMMEDIATELY:   Following lower endoscopy (colonoscopy or flexible sigmoidoscopy):  Excessive amounts of blood in the stool  Significant tenderness or worsening of abdominal pains  Swelling of the abdomen that is new, acute  Fever of 100F or higher   For urgent or emergent issues, a gastroenterologist can be reached at any hour by calling [phone].   DIET: Your first meal following the procedure should be a small meal and then it is ok to progress to your normal diet. Heavy or fried foods are harder to digest and may make you feel nauseous or bloated.  Likewise, meals heavy in dairy and vegetables can increase bloating.  Drink plenty of fluids but you should avoid alcoholic beverages for 24  hours.  ACTIVITY:  You should plan to take it easy for the rest of today and you should NOT DRIVE or use heavy machinery until tomorrow (because of the sedation medicines used during the test).    FOLLOW UP: Our staff will call the number listed on your records the next business day following your procedure to check on you and address any questions or concerns that you may have regarding the information given to you following your procedure. If we do not reach you, we will leave a message.  However, if you are feeling well and you are not experiencing any problems, there is no need to return our call.  We will assume that you have returned to your regular daily activities without incident.  If any biopsies were taken you will be contacted by phone or by letter within the next 1-3 weeks.  Please call us at [phone] if you have not heard about the biopsies in 3 weeks.    SIGNATURES/CONFIDENTIALITY: You and/or your care partner have signed paperwork which will be entered into your electronic medical record.  These signatures attest to the fact that that the information above on your After Visit Summary has been reviewed and is understood.  Full responsibility of the confidentiality of this discharge information lies with you and/or your care-partner. 

## 2015-02-19 NOTE — Op Note (Signed)
[facility] [address] [place], [id number]   COLONOSCOPY PROCEDURE REPORT  PATIENT: Brandi Woodward, Brandi Woodward  MR#: [id number] BIRTHDATE: April 24, 1961 , 54  yrs. old GENDER: female ENDOSCOPIST: Meryl DareMalcolm T Stark, MD, [facility] REFERRED XB:JYNWGBY:Edwin Concepcion ElkAvbuere, M.D. PROCEDURE DATE:  02/19/2015 PROCEDURE:   Colonoscopy, screening First Screening Colonoscopy - Avg.  risk and is 50 yrs.  old or older Yes.  Prior Negative Screening - Now for repeat screening. N/A  History of Adenoma - Now for follow-up colonoscopy & has been > or = to 3 yrs.  N/A ASA CLASS:   Class II INDICATIONS:Screening for colonic neoplasia and Colorectal Neoplasm Risk Assessment for this procedure is average risk. MEDICATIONS: Monitored anesthesia care and Propofol 200 mg IV DESCRIPTION OF PROCEDURE:   After the risks benefits and alternatives of the procedure were thoroughly explained, informed consent was obtained.  The digital rectal exam revealed no abnormalities of the rectum.   The LB [id number]  endoscope was introduced through the anus and advanced to the cecum, which was identified by both the appendix and ileocecal valve. No adverse events experienced with a tortuous colon.   The quality of the prep was good.  (Suprep was used)  The instrument was then slowly withdrawn as the colon was fully examined.    COLON FINDINGS: A normal appearing cecum, ileocecal valve, and appendiceal orifice were identified.  The ascending, transverse, descending, sigmoid colon, and rectum appeared unremarkable. Retroflexion was not performed due to a narrow rectal vault. The time to cecum = 4.8 Withdrawal time = 8.3   The scope was withdrawn and the procedure completed. COMPLICATIONS: There were no immediate complications.  ENDOSCOPIC IMPRESSION: Normal colonoscopy  RECOMMENDATIONS: Continue current colorectal screening recommendations for "routine risk" patients with a repeat colonoscopy in 10 years.  eSigned:   Meryl DareMalcolm T Stark, MD, [facility] 02/19/2015 11:05 AM

## 2015-02-19 NOTE — Progress Notes (Signed)
A/ox3, pleased with MAC, report to RN 

## 2016-05-27 ENCOUNTER — Other Ambulatory Visit: Payer: Self-pay | Admitting: Internal Medicine

## 2016-05-27 DIAGNOSIS — Z1231 Encounter for screening mammogram for malignant neoplasm of breast: Secondary | ICD-10-CM

## 2017-06-24 ENCOUNTER — Telehealth (HOSPITAL_COMMUNITY): Payer: Self-pay

## 2017-06-26 ENCOUNTER — Other Ambulatory Visit: Payer: Self-pay | Admitting: Nurse Practitioner

## 2017-06-26 DIAGNOSIS — I493 Ventricular premature depolarization: Secondary | ICD-10-CM

## 2017-06-30 NOTE — Telephone Encounter (Signed)
User: Trina AoGRIFFIN, Maila Dukes A Date/time: 06/26/17 10:48 AM  Comment: Called pt and was unable to left a message due to there being no VM set up.   Context:  Outcome: No Answer/Busy  Phone number: [phone] Phone Type: Home Phone  Comm. type: Telephone Call type: Outgoing  Contact: Ricky AlaSellars, Shaynna G Relation to patient: Self    User: Maurizio Geno, Rene KocherEGINA A Date/time: 06/24/17 9:06 AM  Comment: Called pt to sch an echo and was unable to leave a message due to no VM being set up.  Context:  Outcome: No Answer/Busy  Phone number: [phone] Phone Type: Home Phone  Comm. type: Telephone Call type: Outgoing  Contact: Ricky AlaSellars, Gillian G Relation to patient: Self

## 2017-07-02 ENCOUNTER — Ambulatory Visit (HOSPITAL_COMMUNITY): Payer: Medicare Other | Attending: Cardiovascular Disease

## 2017-07-02 ENCOUNTER — Other Ambulatory Visit: Payer: Self-pay

## 2017-07-02 DIAGNOSIS — G43909 Migraine, unspecified, not intractable, without status migrainosus: Secondary | ICD-10-CM | POA: Insufficient documentation

## 2017-07-02 DIAGNOSIS — I493 Ventricular premature depolarization: Secondary | ICD-10-CM | POA: Insufficient documentation

## 2017-07-02 DIAGNOSIS — I10 Essential (primary) hypertension: Secondary | ICD-10-CM | POA: Diagnosis not present

## 2017-07-02 DIAGNOSIS — R002 Palpitations: Secondary | ICD-10-CM | POA: Insufficient documentation

## 2017-07-02 DIAGNOSIS — Z720 Tobacco use: Secondary | ICD-10-CM | POA: Insufficient documentation

## 2018-05-11 ENCOUNTER — Emergency Department (HOSPITAL_COMMUNITY)
Admission: EM | Admit: 2018-05-11 | Discharge: 2018-05-12 | Disposition: A | Discharge status: Home / Self Care | Payer: Medicare Other | Attending: Emergency Medicine | Admitting: Emergency Medicine

## 2018-05-11 ENCOUNTER — Encounter (HOSPITAL_COMMUNITY): Payer: Self-pay | Admitting: Emergency Medicine

## 2018-05-11 DIAGNOSIS — I10 Essential (primary) hypertension: Secondary | ICD-10-CM | POA: Diagnosis not present

## 2018-05-11 DIAGNOSIS — Z79899 Other long term (current) drug therapy: Secondary | ICD-10-CM | POA: Diagnosis not present

## 2018-05-11 DIAGNOSIS — M545 Low back pain: Secondary | ICD-10-CM | POA: Diagnosis present

## 2018-05-11 DIAGNOSIS — M5442 Lumbago with sciatica, left side: Secondary | ICD-10-CM

## 2018-05-11 DIAGNOSIS — F17210 Nicotine dependence, cigarettes, uncomplicated: Secondary | ICD-10-CM | POA: Insufficient documentation

## 2018-05-11 MED ORDER — DEXAMETHASONE 4 MG PO TABS
10.0000 mg | ORAL_TABLET | Freq: Once | ORAL | Status: AC
  Administered 2018-05-11: 10 mg via ORAL
  Filled 2018-05-11: qty 2

## 2018-05-11 MED ORDER — KETOROLAC TROMETHAMINE 60 MG/2ML IM SOLN
30.0000 mg | Freq: Once | INTRAMUSCULAR | Status: AC
Start: 2018-05-12 — End: 2018-05-11
  Administered 2018-05-11: 30 mg via INTRAMUSCULAR
  Filled 2018-05-11: qty 2

## 2018-05-11 NOTE — ED Notes (Signed)
ED Provider at bedside. 

## 2018-05-11 NOTE — ED Triage Notes (Signed)
Pt c/o lumbar and sacral back pain that travels around her left hip into her groin. She stated this started Thursday last week and has been getting progressively worse since. Pt stated that she vomited this am after taking medication on an empty stomach which she normally doesn't do. Saw PCP prescribed muscle relaxer has not relieved pain. No hx back issues

## 2018-05-11 NOTE — ED Provider Notes (Signed)
[facility]-EMERGENCY DEPT Provider Note  CSN: [id number] Arrival date & time: 05/11/18 1612  Chief Complaint(s) Back Pain  HPI Brandi Woodward is a 57 y.o. female   The history is provided by the patient.  Back Pain   This is a new problem. The problem has not changed since onset.The pain is associated with no known injury. The pain is present in the sacro-iliac joint. The quality of the pain is described as aching. Radiates to: left groin. The pain is moderate. The symptoms are aggravated by bending, twisting and certain positions. Pertinent negatives include no bowel incontinence and no bladder incontinence. She has tried muscle relaxants for the symptoms. The treatment provided no relief.    Past Medical History Past Medical History:  Diagnosis Date  . Anxiety   . Bipolar 1 disorder (HCC)    was being followed by Dr Milana Kidneyunnigham  . CTS (carpal tunnel syndrome)   . Depression   . Hypertension 08/2003  . Migraines   . Palpitations   . Palpitations    ECHO diagnosed  . Psychiatric problem 08/2003  . Tobacco use disorder    Patient Active Problem List   Diagnosis Date Noted  . Bipolar affective disorder (HCC) 07/31/2014  . Anxiety and depression 07/31/2014  . Tobacco use disorder   . Migraines   . Hypertension 08/28/2003   Home Medication(s) Prior to Admission medications   Medication Sig Start Date End Date Taking? Authorizing Provider  diazepam (VALIUM) 5 MG tablet TAKE 1 TABLET BY MOUTH EVERY 12 HOURS AS NEEDED FOR ANXIETY Patient taking differently: TAKE 0.5 TABLET (2.5 MG) BY MOUTH EVERY 12 HOURS AS NEEDED FOR ANXIETY 09/05/14  Yes Elvina SidleLauenstein, Kurt, MD  hydrochlorothiazide (HYDRODIURIL) 25 MG tablet Take 25 mg by mouth daily.  05/10/18  Yes [provider]  ibuprofen (ADVIL,MOTRIN) 200 MG tablet Take 200 mg by mouth every 6 (six) hours as needed for headache or moderate pain.   Yes [provider]  lovastatin (MEVACOR) 20 MG tablet  Take 20 mg by mouth daily.  05/10/18  Yes [provider]  propranolol ER (INDERAL LA) 80 MG 24 hr capsule Take 80 mg by mouth daily.  03/16/18  Yes [provider]  tiZANidine (ZANAFLEX) 4 MG tablet Take 4 mg by mouth 2 (two) times daily as needed for muscle spasms.  05/06/18  Yes [provider]  albuterol (PROVENTIL HFA;VENTOLIN HFA) 108 (90 BASE) MCG/ACT inhaler Inhale 2 puffs into the lungs every 6 (six) hours as needed for wheezing. Patient not taking: Reported on 02/05/2015 08/19/13   Le, Thao P, DO  hydrochlorothiazide (MICROZIDE) 12.5 MG capsule Take 1 capsule (12.5 mg total) by mouth daily. PATIENT NEEDS AN OFFICE VISIT FOR ADDITIONAL REFILLS. 2nd notice Patient not taking: Reported on 05/11/2018 10/26/14   Le, Thao P, DO  Na Sulfate-K Sulfate-Mg Sulf SOLN (no substitutions)-TAKE AS DIRECTED. Patient not taking: Reported on 05/11/2018 02/05/15   Meryl DareStark, Malcolm T, MD  Past Surgical History Past Surgical History:  Procedure Laterality Date  . LAPAROSCOPIC ENDOMETRIOSIS FULGURATION    . TONSILLECTOMY AND ADENOIDECTOMY    . TOTAL ABDOMINAL HYSTERECTOMY     Family History Family History  Family history unknown: Yes    Social History Social History   Tobacco Use  . Smoking status: Current Every Day Smoker    Packs/day: 0.50    Years: 31.00    Pack years: 15.50    Types: Cigarettes  . Smokeless tobacco: Never Used  Substance Use Topics  . Alcohol use: No  . Drug use: No   Allergies Patient has no known allergies.  Review of Systems Review of Systems  Gastrointestinal: Negative for bowel incontinence.  Genitourinary: Negative for bladder incontinence.  Musculoskeletal: Positive for back pain.   All other systems are reviewed and are negative for acute change except as noted in the HPI  Physical Exam Vital Signs  I have  reviewed the triage vital signs BP (!) 155/100 (BP Location: Left Arm)   Pulse 71   Temp 98.4 F (36.9 C) (Oral)   Resp 16   Ht 5\' 2"  (1.575 m)   Wt 55.3 kg (122 lb)   SpO2 97%   BMI 22.31 kg/m   Physical Exam  Constitutional: She is oriented to person, place, and time. She appears well-developed and well-nourished. No distress.  HENT:  Head: Normocephalic and atraumatic.  Right Ear: External ear normal.  Left Ear: External ear normal.  Nose: Nose normal.  Eyes: Conjunctivae and EOM are normal. No scleral icterus.  Neck: Normal range of motion and phonation normal.  Cardiovascular: Normal rate and regular rhythm.  Pulmonary/Chest: Effort normal. No stridor. No respiratory distress.  Abdominal: She exhibits no distension.  Musculoskeletal: Normal range of motion. She exhibits no edema.       Left hip: She exhibits tenderness.       Lumbar back: She exhibits no bony tenderness.       Back:       Legs: Neurological: She is alert and oriented to person, place, and time.  Spine Exam: Strength: 5/5 throughout LE bilaterally (hip flexion/extension, adduction/abduction; knee flexion/extension; foot dorsiflexion/plantarflexion, inversion/eversion; great toe inversion) Sensation: Intact to light touch in proximal and distal LE bilaterally Reflexes: 1+ quadriceps and achilles reflexes   Skin: She is not diaphoretic.  Psychiatric: She has a normal mood and affect. Her behavior is normal.  Vitals reviewed.   ED Results and Treatments Labs (all labs ordered are listed, but only abnormal results are displayed) Labs Reviewed - No data to display                                                                                                                       EKG  EKG Interpretation  Date/Time:    Ventricular Rate:    PR Interval:    QRS Duration:   QT Interval:    QTC Calculation:   R Axis:     Text Interpretation:  Radiology No results found. Pertinent labs &  imaging results that were available during my care of the patient were reviewed by me and considered in my medical decision making (see chart for details).  Medications Ordered in ED Medications  dexamethasone (DECADRON) tablet 10 mg (10 mg Oral Given 05/11/18 2357)  ketorolac (TORADOL) injection 30 mg (30 mg Intramuscular Given 05/11/18 2358)                                                                                                                                    Procedures Procedures  (including critical care time)  Medical Decision Making / ED Course I have reviewed the nursing notes for this encounter and the patient's prior records (if available in EHR or on provided paperwork).    57 y.o. female presents with back pain in sacroiliac area for 2-3 weeks without signs of radicular pain. No acute traumatic onset. No red flag symptoms of fever, weight loss, saddle anesthesia, weakness, fecal/urinary incontinence or urinary retention.   Suspect MSK etiology vs herniated disc vs bursitis. No indication for imaging emergently. Patient was recommended to take short course of scheduled NSAIDs and engage in early mobility as definitive treatment. Return precautions discussed for worsening or new concerning symptoms.    Final Clinical Impression(s) / ED Diagnoses Final diagnoses:  Acute left-sided low back pain with left-sided sciatica    Disposition: Discharge  Condition: Good  I have discussed the results, Dx and Tx plan with the patient who expressed understanding and agree(s) with the plan. Discharge instructions discussed at great length. The patient was given strict return precautions who verbalized understanding of the instructions. No further questions at time of discharge.    ED Discharge Orders    None       Follow Up: Fleet Contras, MD [address] [place] [address] [phone]  Schedule an appointment as soon as possible for a visit  If symptoms do  not improve or  worsen     This chart was dictated using voice recognition software.  Despite best efforts to proofread,  errors can occur which can change the documentation meaning.   Nira Conn, MD 05/12/18 [phone]

## 2018-05-12 NOTE — Discharge Instructions (Addendum)
You may use over-the-counter Motrin (Ibuprofen), Acetaminophen (Tylenol), topical muscle creams such as SalonPas, Icy Hot, Bengay, etc. Please stretch, apply heat, and have massage therapy for additional assistance. ° °

## 2018-05-17 ENCOUNTER — Other Ambulatory Visit: Payer: Self-pay | Admitting: Obstetrics and Gynecology

## 2018-05-17 DIAGNOSIS — Z1231 Encounter for screening mammogram for malignant neoplasm of breast: Secondary | ICD-10-CM

## 2018-06-04 ENCOUNTER — Ambulatory Visit
Admission: RE | Admit: 2018-06-04 | Discharge: 2018-06-04 | Disposition: A | Discharge status: Home / Self Care | Payer: Medicare Other | Source: Ambulatory Visit | Attending: Obstetrics and Gynecology | Admitting: Obstetrics and Gynecology

## 2018-06-04 DIAGNOSIS — Z1231 Encounter for screening mammogram for malignant neoplasm of breast: Secondary | ICD-10-CM

## 2020-07-29 ENCOUNTER — Encounter (HOSPITAL_BASED_OUTPATIENT_CLINIC_OR_DEPARTMENT_OTHER): Payer: Self-pay | Admitting: *Deleted

## 2020-07-29 ENCOUNTER — Emergency Department (HOSPITAL_BASED_OUTPATIENT_CLINIC_OR_DEPARTMENT_OTHER): Payer: Medicare Other

## 2020-07-29 ENCOUNTER — Other Ambulatory Visit: Payer: Self-pay

## 2020-07-29 DIAGNOSIS — W010XXA Fall on same level from slipping, tripping and stumbling without subsequent striking against object, initial encounter: Secondary | ICD-10-CM | POA: Insufficient documentation

## 2020-07-29 DIAGNOSIS — S52502A Unspecified fracture of the lower end of left radius, initial encounter for closed fracture: Secondary | ICD-10-CM | POA: Insufficient documentation

## 2020-07-29 DIAGNOSIS — F17210 Nicotine dependence, cigarettes, uncomplicated: Secondary | ICD-10-CM | POA: Insufficient documentation

## 2020-07-29 DIAGNOSIS — Z79899 Other long term (current) drug therapy: Secondary | ICD-10-CM | POA: Insufficient documentation

## 2020-07-29 DIAGNOSIS — I10 Essential (primary) hypertension: Secondary | ICD-10-CM | POA: Diagnosis not present

## 2020-07-29 DIAGNOSIS — Y9389 Activity, other specified: Secondary | ICD-10-CM | POA: Insufficient documentation

## 2020-07-29 DIAGNOSIS — S6992XA Unspecified injury of left wrist, hand and finger(s), initial encounter: Secondary | ICD-10-CM | POA: Diagnosis present

## 2020-07-29 NOTE — ED Triage Notes (Signed)
Pt reports she tripped and fell backwards while pressure washing her trailer and landed on her left arm. She reports after this she was trying to catch her cat and she tripped going up stairs and fell again. C/o pain in left wrist and forearm

## 2020-07-30 ENCOUNTER — Emergency Department (HOSPITAL_BASED_OUTPATIENT_CLINIC_OR_DEPARTMENT_OTHER): Payer: Medicare Other

## 2020-07-30 ENCOUNTER — Emergency Department (HOSPITAL_BASED_OUTPATIENT_CLINIC_OR_DEPARTMENT_OTHER)
Admission: EM | Admit: 2020-07-30 | Discharge: 2020-07-30 | Disposition: A | Discharge status: Home / Self Care | Payer: Medicare Other | Attending: Emergency Medicine | Admitting: Emergency Medicine

## 2020-07-30 DIAGNOSIS — S52502A Unspecified fracture of the lower end of left radius, initial encounter for closed fracture: Secondary | ICD-10-CM

## 2020-07-30 MED ORDER — MORPHINE SULFATE (PF) 4 MG/ML IV SOLN
4.0000 mg | Freq: Once | INTRAVENOUS | Status: AC
  Administered 2020-07-30: 4 mg via INTRAMUSCULAR
  Filled 2020-07-30: qty 1

## 2020-07-30 MED ORDER — ONDANSETRON 4 MG PO TBDP
4.0000 mg | ORAL_TABLET | Freq: Once | ORAL | Status: AC
  Administered 2020-07-30: 4 mg via ORAL

## 2020-07-30 MED ORDER — HYDROCODONE-ACETAMINOPHEN 5-325 MG PO TABS: 1.0000 | ORAL_TABLET | Freq: Four times a day (QID) | ORAL | 0 refills | Status: AC | PRN

## 2020-07-30 MED ORDER — ONDANSETRON 4 MG PO TBDP
ORAL_TABLET | ORAL | Status: AC
  Filled 2020-07-30: qty 1

## 2020-07-30 NOTE — Discharge Instructions (Addendum)
You were seen today and you have a fracture of your distal radius in your arm.  Maintain the splint.  Ice and elevate.  Follow-up with hand surgery for cast placement.

## 2020-07-30 NOTE — ED Provider Notes (Signed)
[facility] EMERGENCY DEPARTMENT Provider Note   CSN: [id number] Arrival date & time: 07/29/20  2240     History Chief Complaint  Patient presents with  . Fall    arm pain    Brandi Woodward is a 59 y.o. female.  HPI     This is a 59 year old female with a history of bipolar disorder, hypertension who presents with left arm pain.  Patient reports that she tripped and fell backwards onto her left arm.  She describes her wrist being flexed and "underneath me."  She reports 10 out of 10 pain mostly in the left wrist.  It radiates upwards into her elbow and shoulder and into her hand.  She denies numbness or tingling of her hand.  She is right-handed.  Denies hitting her head or loss of consciousness.  Past Medical History:  Diagnosis Date  . Anxiety   . Bipolar 1 disorder (HCC)    was being followed by Dr Milana Kidney  . CTS (carpal tunnel syndrome)   . Depression   . Hypertension 08/2003  . Migraines   . Palpitations   . Palpitations    ECHO diagnosed  . Psychiatric problem 08/2003  . Tobacco use disorder     Patient Active Problem List   Diagnosis Date Noted  . Bipolar affective disorder (HCC) 07/31/2014  . Anxiety and depression 07/31/2014  . Tobacco use disorder   . Migraines   . Hypertension 08/28/2003    Past Surgical History:  Procedure Laterality Date  . LAPAROSCOPIC ENDOMETRIOSIS FULGURATION    . TONSILLECTOMY AND ADENOIDECTOMY    . TOTAL ABDOMINAL HYSTERECTOMY       OB History   No obstetric history on file.     Family History  Family history unknown: Yes    Social History   Tobacco Use  . Smoking status: Current Every Day Smoker    Packs/day: 0.50    Years: 31.00    Pack years: 15.50    Types: Cigarettes  . Smokeless tobacco: Never Used  Substance Use Topics  . Alcohol use: No  . Drug use: No    Home Medications Prior to Admission medications   Medication Sig Start Date End Date Taking? Authorizing Provider  albuterol  (PROVENTIL HFA;VENTOLIN HFA) 108 (90 BASE) MCG/ACT inhaler Inhale 2 puffs into the lungs every 6 (six) hours as needed for wheezing. Patient not taking: Reported on 02/05/2015 08/19/13   Le, Thao P, DO  diazepam (VALIUM) 5 MG tablet TAKE 1 TABLET BY MOUTH EVERY 12 HOURS AS NEEDED FOR ANXIETY Patient taking differently: TAKE 0.5 TABLET (2.5 MG) BY MOUTH EVERY 12 HOURS AS NEEDED FOR ANXIETY 09/05/14   Elvina Sidle, MD  hydrochlorothiazide (HYDRODIURIL) 25 MG tablet Take 25 mg by mouth daily.  05/10/18   [provider]  hydrochlorothiazide (MICROZIDE) 12.5 MG capsule Take 1 capsule (12.5 mg total) by mouth daily. PATIENT NEEDS AN OFFICE VISIT FOR ADDITIONAL REFILLS. 2nd notice Patient not taking: Reported on 05/11/2018 10/26/14   Le, Thao P, DO  HYDROcodone-acetaminophen (NORCO/VICODIN) 5-325 MG tablet Take 1 tablet by mouth every 6 (six) hours as needed. 07/30/20   Sherrell Farish, Mayer Masker, MD  ibuprofen (ADVIL,MOTRIN) 200 MG tablet Take 200 mg by mouth every 6 (six) hours as needed for headache or moderate pain.    [provider]  lovastatin (MEVACOR) 20 MG tablet Take 20 mg by mouth daily.  05/10/18   [provider]  Na Sulfate-K Sulfate-Mg Sulf SOLN (no substitutions)-TAKE AS DIRECTED.  Patient not taking: Reported on 05/11/2018 02/05/15   Meryl Dare, MD  propranolol ER (INDERAL LA) 80 MG 24 hr capsule Take 80 mg by mouth daily.  03/16/18   [provider]  tiZANidine (ZANAFLEX) 4 MG tablet Take 4 mg by mouth 2 (two) times daily as needed for muscle spasms.  05/06/18   [provider]    Allergies    Fosamax [alendronate]  Review of Systems   Review of Systems  Constitutional: Negative for fever.  Respiratory: Negative for shortness of breath.   Cardiovascular: Negative for chest pain.  Musculoskeletal:       Left wrist pain  Neurological: Negative for syncope, weakness and numbness.  All other systems reviewed and are negative.   Physical  Exam Updated Vital Signs BP (!) 157/74 (BP Location: Right Arm)   Pulse 75   Temp 98.5 F (36.9 C) (Oral)   Resp 20   Ht 1.6 m (5\' 3" )   Wt 56.2 kg   SpO2 95%   BMI 21.97 kg/m   Physical Exam Vitals and nursing note reviewed.  Constitutional:      Appearance: She is well-developed. She is not ill-appearing.  HENT:     Head: Normocephalic and atraumatic.     Mouth/Throat:     Mouth: Mucous membranes are moist.  Eyes:     Pupils: Pupils are equal, round, and reactive to light.  Cardiovascular:     Rate and Rhythm: Normal rate and regular rhythm.  Pulmonary:     Effort: Pulmonary effort is normal. No respiratory distress.  Abdominal:     Palpations: Abdomen is soft.  Musculoskeletal:     Cervical back: Neck supple.     Comments: Tenderness to palpation and swelling noted at the left wrist, no obvious deformity, no overlying skin changes, 2+ radial pulse patient is hesitant to flex and extend her finger secondary to pain, she has normal flexion extension at the elbow and shoulder  Skin:    General: Skin is warm and dry.  Neurological:     Mental Status: She is alert and oriented to person, place, and time.  Psychiatric:        Mood and Affect: Mood normal.     ED Results / Procedures / Treatments   Labs (all labs ordered are listed, but only abnormal results are displayed) Labs Reviewed - No data to display  EKG None  Radiology DG Forearm Left  Result Date: 07/29/2020 CLINICAL DATA:  Status post fall. EXAM: LEFT FOREARM - 2 VIEW COMPARISON:  None. FINDINGS: An acute, nondisplaced fracture deformity is seen involving the distal aspect of the left radius. There is no evidence of dislocation. Mild diffuse soft tissue swelling is seen surrounding the previously noted fracture site. IMPRESSION: Acute nondisplaced fracture of the distal left radius. Electronically Signed   By: 09/28/2020 M.D.   On: 07/29/2020 23:31   DG Wrist Complete Left  Result Date:  07/29/2020 CLINICAL DATA:  Status post fall. EXAM: LEFT WRIST - COMPLETE 3+ VIEW COMPARISON:  None. FINDINGS: Acute nondisplaced fracture deformity is seen involving the distal left radius. There is no evidence of dislocation. There is no evidence of arthropathy or other focal bone abnormality. Mild soft tissue swelling is seen surrounding the previously noted fracture site. IMPRESSION: Acute nondisplaced fracture of the distal left radius. Electronically Signed   By: 09/28/2020 M.D.   On: 07/29/2020 23:32   DG Hand Complete Left  Result Date: 07/30/2020 CLINICAL DATA:  Fall EXAM: LEFT HAND - COMPLETE 3+ VIEW COMPARISON:  07/29/2020 wrist radiograph FINDINGS: There is no evidence of fracture or dislocation of the left hand. There is no evidence of arthropathy or other focal bone abnormality. Unchanged appearance of distal left radius fracture. Soft tissues are unremarkable. IMPRESSION: No fracture or dislocation of the left hand. Unchanged appearance of distal left radius fracture. Electronically Signed   By: Deatra Robinson M.D.   On: 07/30/2020 02:49    Procedures Procedures (including critical care time)  Medications Ordered in ED Medications  ondansetron (ZOFRAN-ODT) 4 MG disintegrating tablet (has no administration in time range)  morphine 4 MG/ML injection 4 mg (4 mg Intramuscular Given 07/30/20 0236)  ondansetron (ZOFRAN-ODT) disintegrating tablet 4 mg (4 mg Oral Given 07/30/20 0305)    ED Course  I have reviewed the triage vital signs and the nursing notes.  Pertinent labs & imaging results that were available during my care of the patient were reviewed by me and considered in my medical decision making (see chart for details).    MDM Rules/Calculators/A&P                           Patient presents with injury to the left wrist.  Reports pain up the entire arm; however, on exam, she is really tender just in the wrist.  Suspect radiating pain.  Exam initially limited secondary to  pain but on repeat exam after pain medication, she is neurovascularly intact.  X-rays are concerning for a distal radius fracture that is nondisplaced.  She is placed in a sugar tong splint.  Will have her follow-up with hand surgery.  After history, exam, and medical workup I feel the patient has been appropriately medically screened and is safe for discharge home. Pertinent diagnoses were discussed with the patient. Patient was given return precautions.   Final Clinical Impression(s) / ED Diagnoses Final diagnoses:  Closed fracture of distal end of left radius, unspecified fracture morphology, initial encounter    Rx / DC Orders ED Discharge Orders         Ordered    HYDROcodone-acetaminophen (NORCO/VICODIN) 5-325 MG tablet  Every 6 hours PRN        07/30/20 0313           Shon Baton, MD 07/30/20 [phone]

## 2020-11-26 ENCOUNTER — Other Ambulatory Visit: Payer: Self-pay | Admitting: Internal Medicine

## 2020-11-27 LAB — SARS-COV-2 RNA,(COVID-19) QUALITATIVE NAAT: SARS CoV2 RNA: NOT DETECTED

## 2021-03-19 ENCOUNTER — Other Ambulatory Visit: Payer: Self-pay | Admitting: Internal Medicine

## 2021-03-20 LAB — COMPLETE METABOLIC PANEL WITH GFR
AG Ratio: 2 (calc) (ref 1.0–2.5)
ALT: 8 U/L (ref 6–29)
AST: 15 U/L (ref 10–35)
Albumin: 4 g/dL (ref 3.6–5.1)
Alkaline phosphatase (APISO): 58 U/L (ref 37–153)
BUN: 15 mg/dL (ref 7–25)
CO2: 29 mmol/L (ref 20–32)
Calcium: 8.7 mg/dL (ref 8.6–10.4)
Chloride: 102 mmol/L (ref 98–110)
Creat: 0.76 mg/dL (ref 0.50–0.99)
GFR, Est African American: 99 mL/min/{1.73_m2} (ref >=60)
GFR, Est Non African American: 85 mL/min/{1.73_m2} (ref >=60)
Globulin: 2 g/dL (calc) (ref 1.9–3.7)
Glucose, Bld: 99 mg/dL (ref 65–99)
Potassium: 3.4 mmol/L — ABNORMAL LOW (ref 3.5–5.3)
Sodium: 144 mmol/L (ref 135–146)
Total Bilirubin: 0.2 mg/dL (ref 0.2–1.2)
Total Protein: 6 g/dL — ABNORMAL LOW (ref 6.1–8.1)

## 2021-03-20 LAB — CBC
HCT: 45 % (ref 35.0–45.0)
Hemoglobin: 15.3 g/dL (ref 11.7–15.5)
MCH: 32.2 pg (ref 27.0–33.0)
MCHC: 34 g/dL (ref 32.0–36.0)
MCV: 94.7 fL (ref 80.0–100.0)
MPV: 9.6 fL (ref 7.5–12.5)
Platelets: 283 10*3/uL (ref 140–400)
RBC: 4.75 10*6/uL (ref 3.80–5.10)
RDW: 12.7 % (ref 11.0–15.0)
WBC: 6.3 10*3/uL (ref 3.8–10.8)

## 2021-03-20 LAB — LIPID PANEL
Cholesterol: 234 mg/dL — ABNORMAL HIGH (ref <200)
HDL: 51 mg/dL (ref >=50)
LDL Cholesterol (Calc): 164 mg/dL (calc) — ABNORMAL HIGH
Non-HDL Cholesterol (Calc): 183 mg/dL (calc) — ABNORMAL HIGH (ref <130)
Total CHOL/HDL Ratio: 4.6 (calc) (ref <5.0)
Triglycerides: 87 mg/dL (ref <150)

## 2021-03-20 LAB — TSH: TSH: 1.77 mIU/L (ref 0.40–4.50)

## 2021-03-20 LAB — VITAMIN D 25 HYDROXY (VIT D DEFICIENCY, FRACTURES): Vit D, 25-Hydroxy: 25 ng/mL — ABNORMAL LOW (ref 30–100)

## 2021-03-26 ENCOUNTER — Other Ambulatory Visit: Payer: Self-pay | Admitting: Internal Medicine

## 2021-03-26 DIAGNOSIS — E2839 Other primary ovarian failure: Secondary | ICD-10-CM

## 2021-04-30 ENCOUNTER — Other Ambulatory Visit: Payer: Self-pay | Admitting: Internal Medicine

## 2021-04-30 DIAGNOSIS — Z1231 Encounter for screening mammogram for malignant neoplasm of breast: Secondary | ICD-10-CM

## 2021-10-14 ENCOUNTER — Ambulatory Visit: Payer: Medicare Other

## 2021-10-14 ENCOUNTER — Other Ambulatory Visit: Payer: Medicare Other

## 2021-10-17 ENCOUNTER — Ambulatory Visit
Admission: RE | Admit: 2021-10-17 | Discharge: 2021-10-17 | Disposition: A | Discharge status: Home / Self Care | Payer: Medicare Other | Source: Ambulatory Visit | Attending: Internal Medicine | Admitting: Internal Medicine

## 2021-10-17 ENCOUNTER — Other Ambulatory Visit: Payer: Medicare Other

## 2021-10-17 DIAGNOSIS — Z1231 Encounter for screening mammogram for malignant neoplasm of breast: Secondary | ICD-10-CM

## 2021-10-24 ENCOUNTER — Ambulatory Visit
Admission: RE | Admit: 2021-10-24 | Discharge: 2021-10-24 | Disposition: A | Discharge status: Home / Self Care | Payer: Medicare Other | Source: Ambulatory Visit | Attending: Internal Medicine | Admitting: Internal Medicine

## 2021-10-24 DIAGNOSIS — E2839 Other primary ovarian failure: Secondary | ICD-10-CM

## 2022-06-19 ENCOUNTER — Other Ambulatory Visit: Payer: Self-pay

## 2022-06-19 ENCOUNTER — Encounter (HOSPITAL_COMMUNITY): Payer: Self-pay | Admitting: Emergency Medicine

## 2022-06-19 ENCOUNTER — Emergency Department (HOSPITAL_COMMUNITY)
Admission: EM | Admit: 2022-06-19 | Discharge: 2022-06-20 | Disposition: A | Discharge status: Home / Self Care | Payer: Medicare Other | Attending: Emergency Medicine | Admitting: Emergency Medicine

## 2022-06-19 ENCOUNTER — Emergency Department (HOSPITAL_COMMUNITY): Payer: Medicare Other

## 2022-06-19 DIAGNOSIS — Y92009 Unspecified place in unspecified non-institutional (private) residence as the place of occurrence of the external cause: Secondary | ICD-10-CM | POA: Insufficient documentation

## 2022-06-19 DIAGNOSIS — M7989 Other specified soft tissue disorders: Secondary | ICD-10-CM | POA: Insufficient documentation

## 2022-06-19 DIAGNOSIS — S99921A Unspecified injury of right foot, initial encounter: Secondary | ICD-10-CM | POA: Diagnosis present

## 2022-06-19 DIAGNOSIS — Y9302 Activity, running: Secondary | ICD-10-CM | POA: Insufficient documentation

## 2022-06-19 DIAGNOSIS — W01198A Fall on same level from slipping, tripping and stumbling with subsequent striking against other object, initial encounter: Secondary | ICD-10-CM | POA: Insufficient documentation

## 2022-06-19 NOTE — ED Triage Notes (Signed)
Pt tripped going into a doorway on the transition a few days ago and has had pain in her right foot/toes, up the back of her leg that is getting worse.  Was not wearing shoes. No laceration.

## 2022-06-19 NOTE — ED Provider Triage Note (Signed)
Emergency Medicine Provider Triage Evaluation Note  Brandi Woodward , a 61 y.o. female  was evaluated in triage.  Pt complains of R foot injury. 3 days ago pt hits her R foot on the transition piece at the door and injured her foot.  Increasing pain and swelling to the foot.  No ankle or knee pain.  Having trouble ambulating.  Was not wearing her shoes at that time  Review of Systems  Positive: As above Negative: As above  Physical Exam  BP (!) 163/67 (BP Location: Left Arm)   Pulse 94   Temp 98.6 F (37 C) (Oral)   Resp 16   SpO2 98%  Gen:   Awake, no distress   Resp:  Normal effort  MSK:   Moves extremities without difficulty  Other:    Medical Decision Making  Medically screening exam initiated at 6:10 PM.  Appropriate orders placed.  Brandi Woodward was informed that the remainder of the evaluation will be completed by another provider, this initial triage assessment does not replace that evaluation, and the importance of remaining in the ED until their evaluation is complete.     Fayrene Helper, PA-C 06/19/22 1811

## 2022-06-20 MED ORDER — CELECOXIB 200 MG PO CAPS: 200.0000 mg | ORAL_CAPSULE | Freq: Two times a day (BID) | ORAL | 0 refills | Status: AC

## 2022-06-20 NOTE — ED Notes (Signed)
Patient verbalizes understanding of discharge instructions. Opportunity for questioning and answers were provided. Armband removed by staff, pt discharged from ED.  

## 2022-06-20 NOTE — Discharge Instructions (Signed)
Contact a health care provider if: Your pain medicine and rest are not helping. Get help right away if: Your foot becomes very painful or numb. Your toes become very pale or turn blue.  Do not bear weight on your foot until you have followed up with the orthopedic doctor.  Keep your foot elevated and use ice to reduce swelling.
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
## 2022-06-20 NOTE — Progress Notes (Signed)
Orthopedic Tech Progress Note Patient Details:  Brandi Woodward 10-27-1961 [id number]  Ortho Devices Type of Ortho Device: Crutches, Short leg splint, Stirrup splint Ortho Device/Splint Location: rle Ortho Device/Splint Interventions: Ordered, Application, Adjustment   Post Interventions Patient Tolerated: Well Instructions Provided: Adjustment of device, Care of device, Poper ambulation with device  Kimberla Driskill L Madonna Flegal 06/20/2022, 1:06 AM

## 2022-10-06 ENCOUNTER — Other Ambulatory Visit: Payer: Self-pay | Admitting: Internal Medicine

## 2022-10-06 DIAGNOSIS — Z1231 Encounter for screening mammogram for malignant neoplasm of breast: Secondary | ICD-10-CM

## 2022-11-28 ENCOUNTER — Ambulatory Visit: Payer: Medicare Other

## 2023-01-06 ENCOUNTER — Ambulatory Visit
Admission: RE | Admit: 2023-01-06 | Discharge: 2023-01-06 | Disposition: A | Discharge status: Home / Self Care | Payer: Medicare Other | Source: Ambulatory Visit | Attending: Internal Medicine | Admitting: Internal Medicine

## 2023-01-06 DIAGNOSIS — Z1231 Encounter for screening mammogram for malignant neoplasm of breast: Secondary | ICD-10-CM

## 2023-02-26 ENCOUNTER — Other Ambulatory Visit: Payer: Self-pay | Admitting: Internal Medicine

## 2023-02-27 LAB — LIPID PANEL
Cholesterol: 190 mg/dL (ref <200)
HDL: 52 mg/dL (ref >=50)
LDL Cholesterol (Calc): 122 mg/dL (calc) — ABNORMAL HIGH
Non-HDL Cholesterol (Calc): 138 mg/dL (calc) — ABNORMAL HIGH (ref <130)
Total CHOL/HDL Ratio: 3.7 (calc) (ref <5.0)
Triglycerides: 66 mg/dL (ref <150)

## 2023-02-27 LAB — COMPLETE METABOLIC PANEL WITH GFR
AG Ratio: 1.8 (calc) (ref 1.0–2.5)
ALT: 5 U/L — ABNORMAL LOW (ref 6–29)
AST: 14 U/L (ref 10–35)
Albumin: 3.6 g/dL (ref 3.6–5.1)
Alkaline phosphatase (APISO): 72 U/L (ref 37–153)
BUN: 15 mg/dL (ref 7–25)
CO2: 20 mmol/L (ref 20–32)
Calcium: 8.3 mg/dL — ABNORMAL LOW (ref 8.6–10.4)
Chloride: 110 mmol/L (ref 98–110)
Creat: 0.58 mg/dL (ref 0.50–1.05)
Globulin: 2 g/dL (calc) (ref 1.9–3.7)
Glucose, Bld: 71 mg/dL (ref 65–99)
Potassium: 4.1 mmol/L (ref 3.5–5.3)
Sodium: 145 mmol/L (ref 135–146)
Total Bilirubin: 0.2 mg/dL (ref 0.2–1.2)
Total Protein: 5.6 g/dL — ABNORMAL LOW (ref 6.1–8.1)
eGFR: 102 mL/min/{1.73_m2} (ref >=60)

## 2023-02-27 LAB — VITAMIN D 25 HYDROXY (VIT D DEFICIENCY, FRACTURES): Vit D, 25-Hydroxy: 47 ng/mL (ref 30–100)

## 2023-02-27 LAB — CBC
HCT: 41.6 % (ref 35.0–45.0)
Hemoglobin: 13.5 g/dL (ref 11.7–15.5)
MCH: 30.4 pg (ref 27.0–33.0)
MCHC: 32.5 g/dL (ref 32.0–36.0)
MCV: 93.7 fL (ref 80.0–100.0)
MPV: 9.6 fL (ref 7.5–12.5)
Platelets: 234 10*3/uL (ref 140–400)
RBC: 4.44 10*6/uL (ref 3.80–5.10)
RDW: 13.1 % (ref 11.0–15.0)
WBC: 4.6 10*3/uL (ref 3.8–10.8)

## 2023-02-27 LAB — TSH: TSH: 1.38 mIU/L (ref 0.40–4.50)

## 2024-02-24 ENCOUNTER — Other Ambulatory Visit: Payer: Self-pay | Admitting: Internal Medicine

## 2024-02-24 DIAGNOSIS — Z1231 Encounter for screening mammogram for malignant neoplasm of breast: Secondary | ICD-10-CM

## 2024-03-01 ENCOUNTER — Ambulatory Visit
Admission: RE | Admit: 2024-03-01 | Discharge: 2024-03-01 | Disposition: A | Discharge status: Home / Self Care | Source: Ambulatory Visit | Attending: Internal Medicine | Admitting: Internal Medicine

## 2024-03-01 DIAGNOSIS — Z1231 Encounter for screening mammogram for malignant neoplasm of breast: Secondary | ICD-10-CM

## 2024-03-04 ENCOUNTER — Other Ambulatory Visit: Payer: Self-pay | Admitting: Internal Medicine

## 2024-03-04 DIAGNOSIS — R928 Other abnormal and inconclusive findings on diagnostic imaging of breast: Secondary | ICD-10-CM

## 2024-03-18 ENCOUNTER — Ambulatory Visit
Admission: RE | Admit: 2024-03-18 | Discharge: 2024-03-18 | Disposition: A | Discharge status: Home / Self Care | Source: Ambulatory Visit | Attending: Internal Medicine | Admitting: Internal Medicine

## 2024-03-18 DIAGNOSIS — R928 Other abnormal and inconclusive findings on diagnostic imaging of breast: Secondary | ICD-10-CM

## 2024-03-28 ENCOUNTER — Other Ambulatory Visit: Payer: Self-pay | Admitting: Internal Medicine

## 2024-03-28 ENCOUNTER — Ambulatory Visit
Admission: RE | Admit: 2024-03-28 | Discharge: 2024-03-28 | Disposition: A | Discharge status: Home / Self Care | Source: Ambulatory Visit | Attending: Internal Medicine | Admitting: Internal Medicine

## 2024-03-28 DIAGNOSIS — R921 Mammographic calcification found on diagnostic imaging of breast: Secondary | ICD-10-CM

## 2024-03-28 DIAGNOSIS — R928 Other abnormal and inconclusive findings on diagnostic imaging of breast: Secondary | ICD-10-CM

## 2024-03-31 ENCOUNTER — Ambulatory Visit
Admission: RE | Admit: 2024-03-31 | Discharge: 2024-03-31 | Disposition: A | Discharge status: Home / Self Care | Source: Ambulatory Visit | Attending: Internal Medicine | Admitting: Internal Medicine

## 2024-03-31 DIAGNOSIS — R921 Mammographic calcification found on diagnostic imaging of breast: Secondary | ICD-10-CM

## 2024-03-31 HISTORY — PX: BREAST BIOPSY: SHX20

## 2024-04-05 LAB — SURGICAL PATHOLOGY

## 2024-07-12 ENCOUNTER — Ambulatory Visit
Admission: EM | Admit: 2024-07-12 | Discharge: 2024-07-12 | Disposition: A | Discharge status: Home / Self Care | Attending: Internal Medicine | Admitting: Internal Medicine

## 2024-07-12 ENCOUNTER — Ambulatory Visit (INDEPENDENT_AMBULATORY_CARE_PROVIDER_SITE_OTHER)

## 2024-07-12 ENCOUNTER — Encounter: Payer: Self-pay | Admitting: Emergency Medicine

## 2024-07-12 DIAGNOSIS — R079 Chest pain, unspecified: Secondary | ICD-10-CM

## 2024-07-12 DIAGNOSIS — R059 Cough, unspecified: Secondary | ICD-10-CM

## 2024-07-12 DIAGNOSIS — R0981 Nasal congestion: Secondary | ICD-10-CM

## 2024-07-12 DIAGNOSIS — R0602 Shortness of breath: Secondary | ICD-10-CM

## 2024-07-12 DIAGNOSIS — R051 Acute cough: Secondary | ICD-10-CM

## 2024-07-12 DIAGNOSIS — J208 Acute bronchitis due to other specified organisms: Secondary | ICD-10-CM

## 2024-07-12 DIAGNOSIS — R062 Wheezing: Secondary | ICD-10-CM

## 2024-07-12 DIAGNOSIS — J209 Acute bronchitis, unspecified: Secondary | ICD-10-CM

## 2024-07-12 LAB — POC COVID19/FLU A&B COMBO
Covid Antigen, POC: NEGATIVE
Influenza A Antigen, POC: NEGATIVE
Influenza B Antigen, POC: NEGATIVE

## 2024-07-12 MED ORDER — AZITHROMYCIN 250 MG PO TABS: ORAL_TABLET | ORAL | 0 refills | Status: AC

## 2024-07-12 MED ORDER — PREDNISONE 20 MG PO TABS
40.0000 mg | ORAL_TABLET | Freq: Every day | ORAL | 0 refills | Status: AC
Start: 2024-07-12 — End: 2024-07-16

## 2024-07-12 MED ORDER — KETOROLAC TROMETHAMINE 30 MG/ML IJ SOLN
30.0000 mg | Freq: Once | INTRAMUSCULAR | Status: AC
  Administered 2024-07-12: 30 mg via INTRAMUSCULAR

## 2024-07-12 MED ORDER — ALBUTEROL SULFATE HFA 108 (90 BASE) MCG/ACT IN AERS: 1.0000 | INHALATION_SPRAY | Freq: Four times a day (QID) | RESPIRATORY_TRACT | 0 refills | Status: AC | PRN

## 2024-07-12 MED ORDER — ALBUTEROL SULFATE (2.5 MG/3ML) 0.083% IN NEBU
2.5000 mg | INHALATION_SOLUTION | Freq: Once | RESPIRATORY_TRACT | Status: AC
  Administered 2024-07-12: 2.5 mg via RESPIRATORY_TRACT

## 2024-07-12 MED ORDER — BENZONATATE 100 MG PO CAPS: 100.0000 mg | ORAL_CAPSULE | Freq: Three times a day (TID) | ORAL | 0 refills | Status: AC

## 2024-07-12 MED ORDER — DEXAMETHASONE SODIUM PHOSPHATE 10 MG/ML IJ SOLN
10.0000 mg | Freq: Once | INTRAMUSCULAR | Status: AC
  Administered 2024-07-12: 10 mg via INTRAMUSCULAR

## 2024-07-12 NOTE — ED Triage Notes (Addendum)
 Pt st's she started feeling bad 5 days ago with sore throat, headache, sneezing, Productive cough (yellow)   and pain in her chest when she coughs.  Also c/o chills  St;s she has taken several OTC meds without relief

## 2024-07-12 NOTE — ED Notes (Signed)
 PT receiving HHN at this time

## 2024-07-12 NOTE — ED Provider Notes (Signed)
 [facility]    CSN: [id number] Arrival date & time: 07/12/24  1549      History   Chief Complaint Chief Complaint  Patient presents with   Shortness of Breath    HPI Brandi Woodward is a 63 y.o. female.   63 year old female presents urgent care with complaints of cough, shortness of breath, nasal congestion, headache and fevers.  Her symptoms started about 5 days ago but have worsened significantly.  She reports that anytime she gets a sinus infection it ends up going to her chest.  She is having tightness and soreness in her chest when she coughs.  The cough is productive.  She denies any known sick exposures.  The headache is severe at times.  She has been using over-the-counter medication without much relief.   Shortness of Breath Associated symptoms: cough, fever and headaches   Associated symptoms: no abdominal pain, no chest pain, no ear pain, no rash, no sore throat and no vomiting     Past Medical History:  Diagnosis Date   Anxiety    Bipolar 1 disorder (HCC)    was being followed by Dr Tex   CTS (carpal tunnel syndrome)    Depression    Hypertension 08/2003   Migraines    Palpitations    Palpitations    ECHO diagnosed   Psychiatric problem 08/2003   Tobacco use disorder     Patient Active Problem List   Diagnosis Date Noted   Bipolar affective disorder (HCC) 07/31/2014   Anxiety and depression 07/31/2014   Tobacco use disorder    Migraines    Hypertension 08/28/2003    Past Surgical History:  Procedure Laterality Date   BREAST BIOPSY Right 03/31/2024   MM RT BREAST BX W LOC DEV 1ST LESION IMAGE BX SPEC STEREO GUIDE 03/31/2024 [facility]   LAPAROSCOPIC ENDOMETRIOSIS FULGURATION     TONSILLECTOMY AND ADENOIDECTOMY     TOTAL ABDOMINAL HYSTERECTOMY      OB History   No obstetric history on file.      Home Medications    Prior to Admission medications   Medication Sig Start Date End Date Taking? Authorizing Provider   azithromycin  (ZITHROMAX ) 250 MG tablet Take first 2 tablets together, then 1 every day until finished. 07/12/24  Yes Braelin Brosch A, PA-C  benzonatate  (TESSALON ) 100 MG capsule Take 1 capsule (100 mg total) by mouth every 8 (eight) hours. 07/12/24  Yes Artice Bergerson A, PA-C  predniSONE  (DELTASONE ) 20 MG tablet Take 2 tablets (40 mg total) by mouth daily with breakfast for 4 days. 07/12/24 07/16/24 Yes Nihal Doan A, PA-C  albuterol  (VENTOLIN  HFA) 108 (90 Base) MCG/ACT inhaler Inhale 1-2 puffs into the lungs every 6 (six) hours as needed for wheezing. 07/12/24   Dominiqua Cooner A, PA-C  celecoxib  (CELEBREX ) 200 MG capsule Take 1 capsule (200 mg total) by mouth 2 (two) times daily. 06/20/22   Harris, Abigail, PA-C  diazepam  (VALIUM ) 5 MG tablet TAKE 1 TABLET BY MOUTH EVERY 12 HOURS AS NEEDED FOR ANXIETY Patient taking differently: TAKE 0.5 TABLET (2.5 MG) BY MOUTH EVERY 12 HOURS AS NEEDED FOR ANXIETY 09/05/14   Mario Million, MD  hydrochlorothiazide  (HYDRODIURIL ) 25 MG tablet Take 25 mg by mouth daily.  05/10/18   [provider]  hydrochlorothiazide  (MICROZIDE ) 12.5 MG capsule Take 1 capsule (12.5 mg total) by mouth daily. PATIENT NEEDS AN OFFICE VISIT FOR ADDITIONAL REFILLS. 2nd notice Patient not taking: Reported on 05/11/2018 10/26/14   Le, Thao  P, DO  HYDROcodone -acetaminophen  (NORCO/VICODIN) 5-325 MG tablet Take 1 tablet by mouth every 6 (six) hours as needed. 07/30/20   Horton, Charmaine FALCON, MD  ibuprofen (ADVIL,MOTRIN) 200 MG tablet Take 200 mg by mouth every 6 (six) hours as needed for headache or moderate pain.    [provider]  lovastatin (MEVACOR) 20 MG tablet Take 20 mg by mouth daily.  05/10/18   [provider]  Na Sulfate-K Sulfate-Mg Sulf SOLN (no substitutions)-TAKE AS DIRECTED. Patient not taking: Reported on 05/11/2018 02/05/15   Aneita Gwendlyn DASEN, MD  propranolol ER (INDERAL LA) 80 MG 24 hr capsule Take 80 mg by mouth daily.  03/16/18   [provider]  tiZANidine (ZANAFLEX) 4 MG tablet Take 4 mg by mouth 2 (two) times daily as needed for muscle spasms.  05/06/18   [provider]    Family History Family History  Problem Relation Age of Onset   Breast cancer Neg Hx    BRCA 1/2 Neg Hx     Social History Social History   Tobacco Use   Smoking status: Every Day    Current packs/day: 0.50    Average packs/day: 0.5 packs/day for 31.0 years (15.5 ttl pk-yrs)    Types: Cigarettes   Smokeless tobacco: Never  Substance Use Topics   Alcohol use: No   Drug use: No     Allergies   Fosamax [alendronate]   Review of Systems Review of Systems  Constitutional:  Positive for chills, fatigue and fever.  HENT:  Positive for congestion. Negative for ear pain and sore throat.   Eyes:  Negative for pain and visual disturbance.  Respiratory:  Positive for cough, chest tightness and shortness of breath.   Cardiovascular:  Negative for chest pain and palpitations.  Gastrointestinal:  Negative for abdominal pain and vomiting.  Genitourinary:  Negative for dysuria and hematuria.  Musculoskeletal:  Negative for arthralgias and back pain.  Skin:  Negative for color change and rash.  Neurological:  Positive for headaches. Negative for seizures and syncope.  All other systems reviewed and are negative.    Physical Exam Triage Vital Signs ED Triage Vitals [07/12/24 1750]  Encounter Vitals Group     BP (!) 140/64     Girls Systolic BP Percentile      Girls Diastolic BP Percentile      Boys Systolic BP Percentile      Boys Diastolic BP Percentile      Pulse Rate (!) 104     Resp (!) 24     Temp 99 F (37.2 C)     Temp Source Oral     SpO2 91 %     Weight      Height      Head Circumference      Peak Flow      Pain Score 8     Pain Loc      Pain Education      Exclude from Growth Chart    No data found.  Updated Vital Signs BP (!) 130/56 (BP Location: Left Arm)   Pulse (!) 105   Temp 98.8 F (37.1 C)  (Oral)   Resp (!) 24   SpO2 92%   Visual Acuity Right Eye Distance:   Left Eye Distance:   Bilateral Distance:    Right Eye Near:   Left Eye Near:    Bilateral Near:     Physical Exam Vitals and nursing note reviewed.  Constitutional:  General: She is not in acute distress.    Appearance: She is well-developed.  HENT:     Head: Normocephalic and atraumatic.  Eyes:     Conjunctiva/sclera: Conjunctivae normal.     Pupils: Pupils are equal, round, and reactive to light.  Cardiovascular:     Rate and Rhythm: Normal rate and regular rhythm.     Heart sounds: No murmur heard. Pulmonary:     Effort: Pulmonary effort is normal. No tachypnea or respiratory distress.     Breath sounds: Examination of the right-upper field reveals rhonchi. Examination of the left-upper field reveals rhonchi. Examination of the right-middle field reveals wheezing and rhonchi. Examination of the left-middle field reveals wheezing and rhonchi. Examination of the right-lower field reveals decreased breath sounds and wheezing. Examination of the left-lower field reveals decreased breath sounds and wheezing. Decreased breath sounds, wheezing and rhonchi present.  Abdominal:     Palpations: Abdomen is soft.     Tenderness: There is no abdominal tenderness.  Musculoskeletal:        General: No swelling.     Cervical back: Neck supple.  Skin:    General: Skin is warm and dry.     Capillary Refill: Capillary refill takes less than 2 seconds.  Neurological:     General: No focal deficit present.     Mental Status: She is alert. She is disoriented.     Cranial Nerves: No cranial nerve deficit.  Psychiatric:        Mood and Affect: Mood normal.      UC Treatments / Results  Labs (all labs ordered are listed, but only abnormal results are displayed) Labs Reviewed  POC COVID19/FLU A&B COMBO - Normal    EKG   Radiology DG Chest 2 View Result Date: 07/12/2024 CLINICAL DATA:  cough, shortness of  breath EXAM: CHEST - 2 VIEW COMPARISON:  12/05/2014 FINDINGS: Biapical pleural thickening. No focal airspace consolidation, pleural effusion, or pneumothorax. No cardiomegaly. Aortic atherosclerosis. No acute fracture or destructive lesions. Multilevel thoracic osteophytosis. IMPRESSION: No acute cardiopulmonary abnormality. Electronically Signed   By: Rogelia Myers M.D.   On: 07/12/2024 18:34    Procedures Procedures (including critical care time)  Medications Ordered in UC Medications  albuterol  (PROVENTIL ) (2.5 MG/3ML) 0.083% nebulizer solution 2.5 mg (2.5 mg Nebulization Given 07/12/24 1805)  ketorolac  (TORADOL ) 30 MG/ML injection 30 mg (30 mg Intramuscular Given 07/12/24 1901)  dexamethasone  (DECADRON ) injection 10 mg (10 mg Intramuscular Given 07/12/24 1903)    Initial Impression / Assessment and Plan / UC Course  I have reviewed the triage vital signs and the nursing notes.  Pertinent labs & imaging results that were available during my care of the patient were reviewed by me and considered in my medical decision making (see chart for details).     Acute cough - Plan: DG Chest 2 View, POC Covid19/Flu A&B Antigen, DG Chest 2 View, POC Covid19/Flu A&B Antigen  Shortness of breath - Plan: DG Chest 2 View, POC Covid19/Flu A&B Antigen, DG Chest 2 View, POC Covid19/Flu A&B Antigen  Nasal congestion - Plan: POC Covid19/Flu A&B Antigen, POC Covid19/Flu A&B Antigen  Chest pain, unspecified - Plan: albuterol  (VENTOLIN  HFA) 108 (90 Base) MCG/ACT inhaler  Wheezing - Plan: albuterol  (VENTOLIN  HFA) 108 (90 Base) MCG/ACT inhaler  Cough - Plan: albuterol  (VENTOLIN  HFA) 108 (90 Base) MCG/ACT inhaler  Acute bronchitis - Plan: albuterol  (VENTOLIN  HFA) 108 (90 Base) MCG/ACT inhaler  Acute bronchitis due to other specified organisms   Flu A,  flu B and COVID testing done today.  These are negative.  Chest x-ray done today.  Final evaluation by the radiologist does not show any acute  abnormalities.  Symptoms physical exam findings are most consistent with a lower respiratory infection vs acute bronchitis.  Given the Symptoms and physical exam findings as well as the duration, we will treat with the following: Decadron  injection given today. This is a steroid to help with inflammation and pain. Toradol  injection given today. This is a medication to help with pain. This is not a narcotic.   Azithromycin  250mg  Take 2 tablets today and the 1 tablet daily for 4 more days. Start 07/13/24: Prednisone  40 mg (2 tablets) once daily for 4 days. Take this in the morning.  This is a steroid to help with inflammation and pain. Albuterol  inhaler 1-2 puffs every 6 hours as needed for wheezing/shortness of breath. Benzonatate  (tessalon ) 100 mg every 8 hours as needed for cough.  Make sure to stay hydrated by drinking plenty of water.  Return to urgent care or PCP if symptoms worsen or fail to resolve.    Final Clinical Impressions(s) / UC Diagnoses   Final diagnoses:  Acute cough  Shortness of breath  Nasal congestion  Acute bronchitis due to other specified organisms     Discharge Instructions      Flu A, flu B and COVID testing done today.  These are negative.  Chest x-ray done today.  Final evaluation by the radiologist does not show any acute abnormalities.  Symptoms physical exam findings are most consistent with a lower respiratory infection vs acute bronchitis.  Given the Symptoms and physical exam findings as well as the duration, we will treat with the following: Decadron  injection given today. This is a steroid to help with inflammation and pain. Toradol  injection given today. This is a medication to help with pain. This is not a narcotic.   Azithromycin  250mg  Take 2 tablets today and the 1 tablet daily for 4 more days. Start 07/13/24: Prednisone  40 mg (2 tablets) once daily for 4 days. Take this in the morning.  This is a steroid to help with inflammation and  pain. Albuterol  inhaler 1-2 puffs every 6 hours as needed for wheezing/shortness of breath. Benzonatate  (tessalon ) 100 mg every 8 hours as needed for cough.  Make sure to stay hydrated by drinking plenty of water.  Return to urgent care or PCP if symptoms worsen or fail to resolve.       ED Prescriptions     Medication Sig Dispense Auth. Provider   azithromycin  (ZITHROMAX ) 250 MG tablet Take first 2 tablets together, then 1 every day until finished. 6 tablet Starlett Pehrson A, PA-C   predniSONE  (DELTASONE ) 20 MG tablet Take 2 tablets (40 mg total) by mouth daily with breakfast for 4 days. 8 tablet Teresa Norris A, PA-C   benzonatate  (TESSALON ) 100 MG capsule Take 1 capsule (100 mg total) by mouth every 8 (eight) hours. 21 capsule Megumi Treaster A, PA-C   albuterol  (VENTOLIN  HFA) 108 (90 Base) MCG/ACT inhaler Inhale 1-2 puffs into the lungs every 6 (six) hours as needed for wheezing. 1 each Teresa Norris LABOR, PA-C      PDMP not reviewed this encounter.   Teresa Norris LABOR, [place] 07/12/24 1951

## 2024-07-12 NOTE — Discharge Instructions (Addendum)
 Flu A, flu B and COVID testing done today.  These are negative.  Chest x-ray done today.  Final evaluation by the radiologist does not show any acute abnormalities.  Symptoms physical exam findings are most consistent with a lower respiratory infection vs acute bronchitis.  Given the Symptoms and physical exam findings as well as the duration, we will treat with the following: Decadron  injection given today. This is a steroid to help with inflammation and pain. Toradol  injection given today. This is a medication to help with pain. This is not a narcotic.   Azithromycin  250mg  Take 2 tablets today and the 1 tablet daily for 4 more days. Start 07/13/24: Prednisone  40 mg (2 tablets) once daily for 4 days. Take this in the morning.  This is a steroid to help with inflammation and pain. Albuterol  inhaler 1-2 puffs every 6 hours as needed for wheezing/shortness of breath. Benzonatate  (tessalon ) 100 mg every 8 hours as needed for cough.  Make sure to stay hydrated by drinking plenty of water.  Return to urgent care or PCP if symptoms worsen or fail to resolve.

## 2024-12-13 ENCOUNTER — Ambulatory Visit: Admitting: Physician Assistant
